# Patient Record
Sex: Male | Born: 1980 | Race: White | Hispanic: No | Marital: Single | Smoking: Former smoker
Health system: Southern US, Community
[De-identification: ages and names within clinical notes are randomized; demographics above are authoritative.]

## PROBLEM LIST (undated history)

## (undated) DIAGNOSIS — J45909 Unspecified asthma, uncomplicated: Secondary | ICD-10-CM

---

## 2001-09-19 ENCOUNTER — Encounter: Admission: RE | Admit: 2001-09-19 | Discharge: 2001-09-19 | Payer: Self-pay | Admitting: General Surgery

## 2001-09-19 ENCOUNTER — Encounter: Payer: Self-pay | Admitting: General Surgery

## 2006-09-01 ENCOUNTER — Emergency Department (HOSPITAL_COMMUNITY): Admission: EM | Admit: 2006-09-01 | Discharge: 2006-09-02 | Payer: Self-pay | Admitting: Emergency Medicine

## 2006-09-02 ENCOUNTER — Emergency Department (HOSPITAL_COMMUNITY): Admission: EM | Admit: 2006-09-02 | Discharge: 2006-09-03 | Payer: Self-pay | Admitting: Emergency Medicine

## 2006-09-05 ENCOUNTER — Emergency Department (HOSPITAL_COMMUNITY): Admission: EM | Admit: 2006-09-05 | Discharge: 2006-09-05 | Payer: Self-pay | Admitting: Family Medicine

## 2006-12-07 ENCOUNTER — Emergency Department (HOSPITAL_COMMUNITY): Admission: EM | Admit: 2006-12-07 | Discharge: 2006-12-07 | Payer: Self-pay | Admitting: Emergency Medicine

## 2008-12-12 ENCOUNTER — Emergency Department (HOSPITAL_COMMUNITY): Admission: EM | Admit: 2008-12-12 | Discharge: 2008-12-12 | Payer: Self-pay | Admitting: Family Medicine

## 2012-01-17 ENCOUNTER — Emergency Department (HOSPITAL_COMMUNITY): Payer: BC Managed Care – PPO

## 2012-01-17 ENCOUNTER — Encounter (HOSPITAL_COMMUNITY): Payer: Self-pay | Admitting: *Deleted

## 2012-01-17 ENCOUNTER — Emergency Department (HOSPITAL_COMMUNITY)
Admission: EM | Admit: 2012-01-17 | Discharge: 2012-01-17 | Disposition: A | Discharge status: Home / Self Care | Payer: BC Managed Care – PPO | Attending: Emergency Medicine | Admitting: Emergency Medicine

## 2012-01-17 DIAGNOSIS — Y93K1 Activity, walking an animal: Secondary | ICD-10-CM | POA: Insufficient documentation

## 2012-01-17 DIAGNOSIS — Z881 Allergy status to other antibiotic agents status: Secondary | ICD-10-CM | POA: Insufficient documentation

## 2012-01-17 DIAGNOSIS — X500XXA Overexertion from strenuous movement or load, initial encounter: Secondary | ICD-10-CM | POA: Insufficient documentation

## 2012-01-17 DIAGNOSIS — J45909 Unspecified asthma, uncomplicated: Secondary | ICD-10-CM | POA: Insufficient documentation

## 2012-01-17 DIAGNOSIS — Z87891 Personal history of nicotine dependence: Secondary | ICD-10-CM | POA: Insufficient documentation

## 2012-01-17 DIAGNOSIS — S43109A Unspecified dislocation of unspecified acromioclavicular joint, initial encounter: Secondary | ICD-10-CM | POA: Insufficient documentation

## 2012-01-17 HISTORY — DX: Unspecified asthma, uncomplicated: J45.909

## 2012-01-17 MED ORDER — OXYCODONE-ACETAMINOPHEN 5-325 MG PO TABS
2.0000 | ORAL_TABLET | Freq: Once | ORAL | Status: AC
  Administered 2012-01-17: 2 via ORAL
  Filled 2012-01-17: qty 2

## 2012-01-17 MED ORDER — ONDANSETRON 4 MG PO TBDP
8.0000 mg | ORAL_TABLET | Freq: Once | ORAL | Status: AC
  Administered 2012-01-17: 8 mg via ORAL
  Filled 2012-01-17: qty 2

## 2012-01-17 MED ORDER — OXYCODONE-ACETAMINOPHEN 5-325 MG PO TABS: 2.0000 | ORAL_TABLET | ORAL | Status: AC | PRN

## 2012-01-17 MED ORDER — NAPROXEN 500 MG PO TABS: 500.0000 mg | ORAL_TABLET | Freq: Two times a day (BID) | ORAL | Status: AC

## 2012-01-17 NOTE — ED Notes (Signed)
Patient returned from X-ray 

## 2012-01-17 NOTE — ED Notes (Signed)
Pt discharged in good condition after verbalizing understanding of discharge instructions.

## 2012-01-17 NOTE — Discharge Instructions (Signed)
Wear sling for comfort. Take pain medicine as needed. Please followup with orthopedics in one week if you're not feeling better.  Acromioclavicular Injuries The AC (acromioclavicular) joint is the joint in the shoulder where the collarbone (clavicle) meets the shoulder blade (scapula). The part of the shoulder blade connected to the collarbone is called the acromion. Common problems with and treatments for the [facility] joint are detailed below. ARTHRITIS Arthritis occurs when the joint has been injured and the smooth padding between the joints (cartilage) is lost. This is the wear and tear seen in most joints of the body if they have been overused. This causes the joint to produce pain and swelling which is worse with activity.  AC JOINT SEPARATION AC joint separation means that the ligaments connecting the acromion of the shoulder blade and collarbone have been damaged, and the two bones no longer line up. AC separations can be anywhere from mild to severe, and are "graded" depending upon which ligaments are torn and how badly they are torn.  Grade I Injury: the least damage is done, and the [facility] joint still lines up.   Grade II Injury: damage to the ligaments which reinforce the [facility] joint. In a Grade II injury, these ligaments are stretched but not entirely torn. When stressed, the [facility] joint becomes painful and unstable.   Grade III Injury: AC and secondary ligaments are completely torn, and the collarbone is no longer attached to the shoulder blade. This results in deformity; a prominence of the end of the clavicle.  AC JOINT FRACTURE AC joint fracture means that there has been a break in the bones of the [facility] joint, usually the end of the clavicle. TREATMENT TREATMENT OF AC ARTHRITIS  There is currently no way to replace the cartilage damaged by arthritis. The best way to improve the condition is to decrease the activities which aggravate the problem. Application of ice to the joint helps decrease pain  and soreness (inflammation). The use of non-steroidal anti-inflammatory medication is helpful.   If less conservative measures do not work, then cortisone shots (injections) may be used. These are anti-inflammatories; they decrease the soreness in the joint and swelling.   If non-surgical measures fail, surgery may be recommended. The procedure is generally removal of a portion of the end of the clavicle. This is the part of the collarbone closest to your acromion which is stabilized with ligaments to the acromion of the shoulder blade. This surgery may be performed using a tube-like instrument with a light (arthroscope) for looking into a joint. It may also be performed as an open surgery through a small incision by the surgeon. Most patients will have good range of motion within 6 weeks and may return to all activity including sports by 8-12 weeks, barring complications.  TREATMENT OF AN AC SEPARATION  The initial treatment is to decrease pain. This is best accomplished by immobilizing the arm in a sling and placing an ice pack to the shoulder for 20 to 30 minutes every 2 hours as needed. As the pain starts to subside, it is important to begin moving the fingers, wrist, elbow and eventually the shoulder in order to prevent a stiff or "frozen" shoulder. Instruction on when and how much to move the shoulder will be provided by your caregiver. The length of time needed to regain full motion and function depends on the amount or grade of the injury. Recovery from a Grade I AC separation usually takes 10 to 14 days, whereas a  Grade III may take 6 to 8 weeks.   Grade I and II separations usually do not require surgery. Even Grade III injuries usually allow return to full activity with few restrictions. Treatment is also based on the activity demands of the injured shoulder. For example, a high level quarterback with an injured throwing arm will receive more aggressive treatment than someone with a desk job who  rarely uses his/her arm for strenuous activities. In some cases, a painful lump may persist which could require a later surgery. Surgery can be very successful, but the benefits must be weighed against the potential risks.  TREATMENT OF AN AC JOINT FRACTURE Fracture treatment depends on the type of fracture. Sometimes a splint or sling may be all that is required. Other times surgery may be required for repair. This is more frequently the case when the ligaments supporting the clavicle are completely torn. Your caregiver will help you with these decisions and together you can decide what will be the best treatment. HOME CARE INSTRUCTIONS   Apply ice to the injury for 15 to 20 minutes each hour while awake for 2 days. Put the ice in a plastic bag and place a towel between the bag of ice and skin.   If a sling has been applied, wear it constantly for as long as directed by your caregiver, even at night. The sling or splint can be removed for bathing or showering or as directed. Be sure to keep the shoulder in the same place as when the sling is on. Do not lift the arm.   If a figure-of-eight splint has been applied it should be tightened gently by another person every day. Tighten it enough to keep the shoulders held back. Allow enough room to place the index finger between the body and strap. Loosen the splint immediately if there is numbness or tingling in the hands.   Take over-the-counter or prescription medicines for pain, discomfort or fever as directed by your caregiver.   If you or your child has received a follow up appointment, it is very important to keep that appointment in order to avoid long term complications, chronic pain or disability.  SEEK MEDICAL CARE IF:   The pain is not relieved with medications.   There is increased swelling or discoloration that continues to get worse rather than better.   You or your child has been unable to follow up as instructed.   There is  progressive numbness and tingling in the arm, forearm or hand.  SEEK IMMEDIATE MEDICAL CARE IF:   The arm is numb, cold or pale.   There is increasing pain in the hand, forearm or fingers.  MAKE SURE YOU:   Understand these instructions.   Will watch your condition.   Will get help right away if you are not doing well or get worse.  Document Released: 04/20/2005 Document Revised: 06/30/2011 Document Reviewed: 10/13/2008 [facility] Patient Information 2012 [place], [place].

## 2012-01-17 NOTE — ED Notes (Signed)
Pt was walking dog and dog went after squirrel.  Pt states that he tried to tough it out for the last 5 hours.  Pulse present

## 2012-01-17 NOTE — ED Notes (Signed)
Ortho at bedside.

## 2012-01-17 NOTE — ED Provider Notes (Signed)
History     CSN: [id number]  Arrival date & time 01/17/12  7829   First MD Initiated Contact with Patient 01/17/12 [phone]      Chief Complaint  Patient presents with  . Shoulder Pain    right    (Consider location/radiation/quality/duration/timing/severity/associated sxs/prior treatment) HPI 31 yo male presents to the ER c/o right shoulder pain.  Pt reports he was walking his dog when the dog started after a squirrel, pulling on his right arm.  Pain since the injury, about 6 hours ago.  No prior h/o similar pain.  Pain with trying to lift right arm up.  No elbow, wrist or hand pain.  Pt is LHD.  No other injury or complaint.   Past Medical History  Diagnosis Date  . Asthma     History reviewed. No pertinent past surgical history.  No family history on file.  History  Substance Use Topics  . Smoking status: Former Games developer  . Smokeless tobacco: Not on file  . Alcohol Use: Yes      Review of Systems  All other systems reviewed and are negative.    Allergies  Prednisone  Home Medications   Current Outpatient Rx  Name Route Sig Dispense Refill  . NAPROXEN 500 MG PO TABS Oral Take 1 tablet (500 mg total) by mouth 2 (two) times daily. 30 tablet 0  . OXYCODONE-ACETAMINOPHEN 5-325 MG PO TABS Oral Take 2 tablets by mouth every 4 (four) hours as needed for pain. 20 tablet 0    BP 128/84  Pulse 87  Temp 97.6 F (36.4 C) (Oral)  Resp 16  SpO2 96%  Physical Exam  Nursing note and vitals reviewed. Constitutional: He is oriented to person, place, and time. He appears well-developed and well-nourished. He appears distressed (uncomfortable appearing).  HENT:  Head: Normocephalic and atraumatic.  Nose: Nose normal.  Mouth/Throat: Oropharynx is clear and moist. No oropharyngeal exudate.  Neck: Normal range of motion. Neck supple. No JVD present. No tracheal deviation present. No thyromegaly present.  Pulmonary/Chest: No stridor.  Musculoskeletal: He exhibits tenderness  (decreased ROM at right shoulder due to pain.  Pain with palpation of right AC, mild edema without deformity). He exhibits no edema.  Lymphadenopathy:    He has no cervical adenopathy.  Neurological: He is alert and oriented to person, place, and time. He exhibits normal muscle tone. Coordination normal.  Skin: Skin is warm and dry. No rash noted. No erythema. No pallor.    ED Course  Procedures (including critical care time)  Labs Reviewed - No data to display Dg Shoulder Right  01/17/2012  *RADIOLOGY REPORT*  Clinical Data: Right shoulder joint pain after pulling injury.  RIGHT SHOULDER - 2+ VIEW  Comparison: None.  Findings: Minimal widening of the right acromioclavicular joint without displacement may suggest grade 1 ligamentous injury. Coracoclavicular spaces intact.  Sub acromial space is intact.  No evidence of glenohumeral joint dislocation.  No acute fractures demonstrated.  No focal bone lesion or bone destruction.  IMPRESSION: Slight widening of the acromioclavicular joint without displacement may suggest a low grade ligamentous injury.  No acute fracture or dislocation.  Original Report Authenticated By: Marlon Pel, M.D.     1. AC separation       MDM  31 yo male with acute injury to right shoulder, suspect grade 1 ac sprain/separation.  Will treat pain, place in sling and f/u with ortho as needed.        Olivia Mackie, MD  01/17/12 0745 

## 2016-01-19 ENCOUNTER — Encounter (HOSPITAL_COMMUNITY): Payer: Self-pay | Admitting: Adult Health

## 2016-01-19 DIAGNOSIS — J45909 Unspecified asthma, uncomplicated: Secondary | ICD-10-CM | POA: Insufficient documentation

## 2016-01-19 DIAGNOSIS — R109 Unspecified abdominal pain: Secondary | ICD-10-CM | POA: Diagnosis not present

## 2016-01-19 DIAGNOSIS — Z87891 Personal history of nicotine dependence: Secondary | ICD-10-CM | POA: Insufficient documentation

## 2016-01-19 LAB — COMPREHENSIVE METABOLIC PANEL
ALBUMIN: 4.1 g/dL (ref 3.5–5.0)
ALT: 22 U/L (ref 17–63)
AST: 19 U/L (ref 15–41)
Alkaline Phosphatase: 60 U/L (ref 38–126)
Anion gap: 6 (ref 5–15)
BUN: 11 mg/dL (ref 6–20)
CHLORIDE: 106 mmol/L (ref 101–111)
CO2: 26 mmol/L (ref 22–32)
Calcium: 9.4 mg/dL (ref 8.9–10.3)
Creatinine, Ser: 1.36 mg/dL — ABNORMAL HIGH (ref 0.61–1.24)
GFR calc Af Amer: >60 mL/min (ref >60)
GFR calc non Af Amer: >60 mL/min (ref >60)
GLUCOSE: 109 mg/dL — AB (ref 65–99)
POTASSIUM: 3.5 mmol/L (ref 3.5–5.1)
Sodium: 138 mmol/L (ref 135–145)
Total Bilirubin: 0.3 mg/dL (ref 0.3–1.2)
Total Protein: 6.6 g/dL (ref 6.5–8.1)

## 2016-01-19 LAB — CBC
HEMATOCRIT: 40.5 % (ref 39.0–52.0)
Hemoglobin: 13.8 g/dL (ref 13.0–17.0)
MCH: 29.4 pg (ref 26.0–34.0)
MCHC: 34.1 g/dL (ref 30.0–36.0)
MCV: 86.4 fL (ref 78.0–100.0)
Platelets: 296 10*3/uL (ref 150–400)
RBC: 4.69 MIL/uL (ref 4.22–5.81)
RDW: 12.4 % (ref 11.5–15.5)
WBC: 6.9 10*3/uL (ref 4.0–10.5)

## 2016-01-19 LAB — LIPASE, BLOOD: LIPASE: 29 U/L (ref 11–51)

## 2016-01-19 NOTE — ED Notes (Signed)
Presents with one month of left upper abdominal pain began to worsen over the past two weeks. Today began to have 2 bouts of diarrhea. Pain is described as diacomfort and constant 3 and worse at end of the day. denis constipation., denies nausea, vomitng and diarrhea.

## 2016-01-20 ENCOUNTER — Encounter (HOSPITAL_COMMUNITY): Payer: Self-pay | Admitting: Radiology

## 2016-01-20 ENCOUNTER — Emergency Department (HOSPITAL_COMMUNITY): Payer: 59

## 2016-01-20 ENCOUNTER — Emergency Department (HOSPITAL_COMMUNITY)
Admission: EM | Admit: 2016-01-20 | Discharge: 2016-01-20 | Disposition: A | Discharge status: Home / Self Care | Payer: 59 | Attending: Emergency Medicine | Admitting: Emergency Medicine

## 2016-01-20 ENCOUNTER — Inpatient Hospital Stay (HOSPITAL_COMMUNITY): Admission: AD | Admit: 2016-01-20 | Payer: 59 | Source: Ambulatory Visit | Admitting: General Surgery

## 2016-01-20 ENCOUNTER — Other Ambulatory Visit: Payer: Self-pay | Admitting: General Surgery

## 2016-01-20 DIAGNOSIS — R109 Unspecified abdominal pain: Secondary | ICD-10-CM

## 2016-01-20 LAB — URINALYSIS, ROUTINE W REFLEX MICROSCOPIC
Bilirubin Urine: NEGATIVE
Glucose, UA: NEGATIVE mg/dL
Hgb urine dipstick: NEGATIVE
Ketones, ur: 15 mg/dL — AB
LEUKOCYTES UA: NEGATIVE
Nitrite: NEGATIVE
PROTEIN: NEGATIVE mg/dL
SPECIFIC GRAVITY, URINE: 1.033 — AB (ref 1.005–1.030)
pH: 6.5 (ref 5.0–8.0)

## 2016-01-20 MED ORDER — IOPAMIDOL (ISOVUE-300) INJECTION 61%
INTRAVENOUS | Status: AC
  Administered 2016-01-20: 100 mL
  Filled 2016-01-20: qty 100

## 2016-01-20 MED ORDER — SODIUM CHLORIDE 0.9 % IV BOLUS (SEPSIS)
2000.0000 mL | Freq: Once | INTRAVENOUS | Status: AC
  Administered 2016-01-20: 2000 mL via INTRAVENOUS

## 2016-01-20 NOTE — ED Notes (Signed)
Pt returned to room with no assistance from family member.
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
## 2016-01-20 NOTE — ED Provider Notes (Signed)
35 year old male here with abdominal pain and CT scan concerning for appendix issues but no obvious appendicitis. Plan was for surgery to see the patient.   Surgery saw the patient and recommended laparoscopic appendectomy with exploration to evaluate the patient's abdominal pain however patient refused this time and rather see gastroenterology first he will be discharged AGAINST MEDICAL ADVICE however given follow-up information for surgery and for gastroenterology. Will return here for any new or worsening symptoms or if he changes his mind.  I determined that the patient had the capacity to make their decision and understood the consequences of that decision.We discussed the nature and purpose, risks and benefits, as well as, the alternatives of treatment. Time was given to allow the opportunity to ask questions and consider their options, and after the discussion, the patient decided to refuse the offerred treatment. The patient was informed that refusal could lead to, but was not limited to, death, permanent disability, or severe pain. If present, I asked the relatives or significant others to dissuade them without success. Even after refusal, I made every reasonable opportunity to treat them to the best of my ability.   Marily MemosJason Journei Thomassen, MD 01/20/16 [phone]

## 2016-01-20 NOTE — ED Provider Notes (Signed)
CSN: [id number]     Arrival date & time 01/19/16  2218 History   First MD Initiated Contact with Patient 01/20/16 0242     Chief Complaint  Patient presents with  . Abdominal Pain     (Consider location/radiation/quality/duration/timing/severity/associated sxs/prior Treatment) HPI Patient presents to the emergency department with abdominal pain over the last month.  The patient states that it seems to get worse over the last few days.  Patient states that he did not take any medications prior to arrival for symptoms.  He denies any alleviating factors.  He states that movement and palpation makes the pain worse. The patient denies chest pain, shortness of breath, headache,blurred vision, neck pain, fever, cough, weakness, numbness, dizziness, anorexia, edema, nausea, vomiting, diarrhea, rash, back pain, dysuria, hematemesis, bloody stool, near syncope, or syncope.  Patient denies any injury. Past Medical History  Diagnosis Date  . Asthma    History reviewed. No pertinent past surgical history. History reviewed. No pertinent family history. Social History  Substance Use Topics  . Smoking status: Former Games developermoker  . Smokeless tobacco: None  . Alcohol Use: Yes    Review of Systems All other systems negative except as documented in the HPI. All pertinent positives and negatives as reviewed in the HPI.   Allergies  Prednisone  Home Medications   Prior to Admission medications   Not on File   BP 127/92 mmHg  Pulse 81  Temp(Src) 97.8 F (36.6 C) (Oral)  Resp 18  Ht 5\' 10"  (1.778 m)  Wt 62.823 kg  BMI 19.87 kg/m2  SpO2 98% Physical Exam  Constitutional: He is oriented to person, place, and time. He appears well-developed and well-nourished. No distress.  HENT:  Head: Normocephalic and atraumatic.  Mouth/Throat: Oropharynx is clear and moist.  Eyes: Pupils are equal, round, and reactive to light.  Neck: Normal range of motion. Neck supple.  Cardiovascular: Normal rate,  regular rhythm and normal heart sounds.  Exam reveals no gallop and no friction rub.   No murmur heard. Pulmonary/Chest: Effort normal and breath sounds normal. No respiratory distress. He has no wheezes.  Abdominal: Soft. Bowel sounds are normal. He exhibits no distension. There is tenderness. There is guarding. There is no rebound.  Neurological: He is alert and oriented to person, place, and time. He exhibits normal muscle tone. Coordination normal.  Skin: Skin is warm and dry. No rash noted. No erythema.  Psychiatric: He has a normal mood and affect. His behavior is normal.  Nursing note and vitals reviewed.   ED Course  Procedures (including critical care time) Labs Review Labs Reviewed  COMPREHENSIVE METABOLIC PANEL - Abnormal; Notable for the following:    Glucose, Bld 109 (*)    Creatinine, Ser 1.36 (*)    All other components within normal limits  URINALYSIS, ROUTINE W REFLEX MICROSCOPIC (NOT AT [facility]) - Abnormal; Notable for the following:    APPearance CLOUDY (*)    Specific Gravity, Urine 1.033 (*)    Ketones, ur 15 (*)    All other components within normal limits  LIPASE, BLOOD  CBC    Imaging Review Ct Abdomen Pelvis W Contrast  01/20/2016  CLINICAL DATA:  Left upper abdominal pain for 1 month, worsened over the past 2 weeks. Diarrhea began today. EXAM: CT ABDOMEN AND PELVIS WITH CONTRAST TECHNIQUE: Multidetector CT imaging of the abdomen and pelvis was performed using the standard protocol following bolus administration of intravenous contrast. CONTRAST:  100mL ISOVUE-300 IOPAMIDOL (ISOVUE-300) INJECTION 61% COMPARISON:  None. FINDINGS: Lower chest:  No significant abnormality Hepatobiliary: There are normal appearances of the liver, gallbladder and bile ducts. Pancreas: Normal Spleen: Normal Adrenals/Urinary Tract: The adrenals and kidneys are normal in appearance. There is no urinary calculus evident. There is no hydronephrosis or ureteral dilatation. Collecting systems  and ureters appear unremarkable. Stomach/Bowel: There is mild dilatation of the appendix, up to 8 mm distally and 7 mm in its proximal to mid portions. There is mild fatty hypertrophy in the appendiceal wall. This abnormal appendix is not necessarily acutely inflamed, and there is a least 1 air bubble within the lumen. No periappendiceal abscess. Stomach, small bowel and remainder of the colon are unremarkable. Vascular/Lymphatic: The abdominal aorta is normal in caliber. There is no atherosclerotic calcification. There is no adenopathy in the abdomen or pelvis. Reproductive: No significant abnormality Other: No ascites. Musculoskeletal: No significant skeletal lesion. IMPRESSION: Abnormal appendix, mildly dilated but there is at least 1 air bubble within the appendiceal lumen. Mild fatty hypertrophy of the appendiceal wall. This could represent mild chronic inflammation or mucocele. No abscess. No extraluminal air. These results will be called to the ordering clinician or representative by the Radiologist Assistant, and communication documented in the PACS or zVision Dashboard. Electronically Signed   By: Ellery Plunkaniel R Mitchell M.D.   On: 01/20/2016 05:37   I have personally reviewed and evaluated these images and lab results as part of my medical decision-making.  Patient is advised plan I spoke with general surgery, Dr. Dwain SarnaWakefield, and he will be down to evaluate the patient's abdomen is a CT scan did not give a definitive diagnosis for this abdominal pain   Charlestine NightChristopher Okema Rollinson, PA-C 01/20/16 [id number]  Dione Boozeavid Glick, MD 01/20/16 [phone]

## 2016-01-20 NOTE — ED Notes (Signed)
Pt ambulated to restroom with help from family member.

## 2016-01-25 ENCOUNTER — Telehealth (HOSPITAL_BASED_OUTPATIENT_CLINIC_OR_DEPARTMENT_OTHER): Payer: Self-pay | Admitting: Emergency Medicine

## 2017-10-14 IMAGING — CT CT ABD-PELV W/ CM
2 of 4 series · 15 of 46 positions shown, 17 images · IV contrast (Omni 300)
Comparison: None.

CLINICAL DATA: Left upper abdominal pain for 1 month, worsened over
the past 2 weeks. Diarrhea began today.

EXAM:
CT ABDOMEN AND PELVIS WITH CONTRAST
TECHNIQUE: Multidetector CT imaging of the abdomen and pelvis was performed
using the standard protocol following bolus administration of
intravenous contrast.
CONTRAST:  100mL 85Q1LE-5EE IOPAMIDOL (85Q1LE-5EE) INJECTION 61%

[Series 2: a/p w/ 5mm · axial · 0.73mm/px · z∈[-587,-92]mm · 12 of 109 slices shown, 14 images]
[im 5/109  soft-tissue]
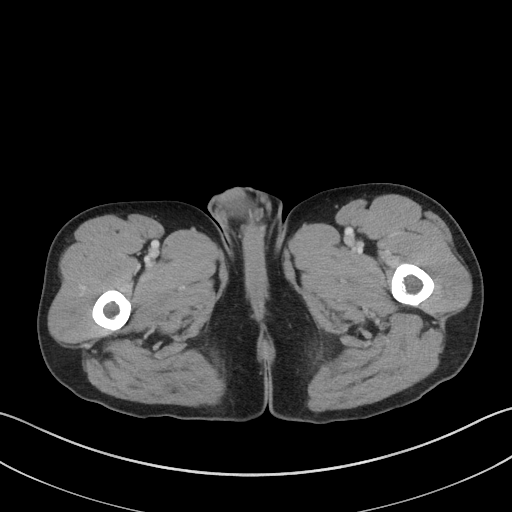
[im 5/109  bone]
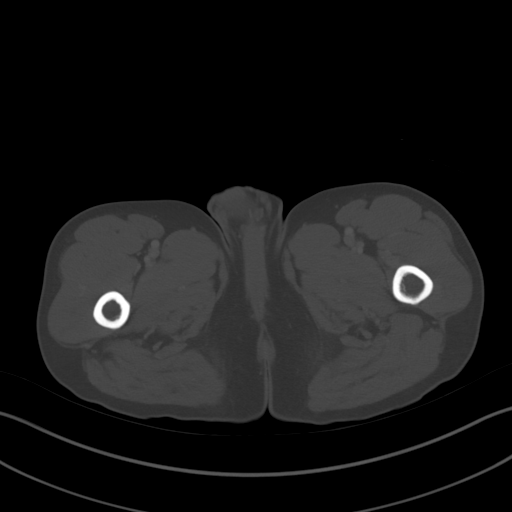
[im 15/109  soft-tissue]
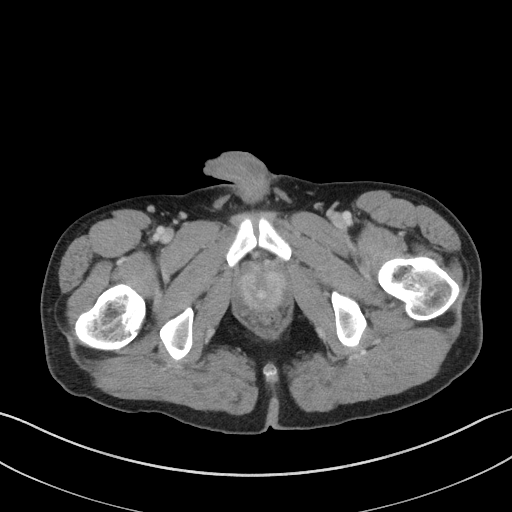
[im 24/109  soft-tissue]
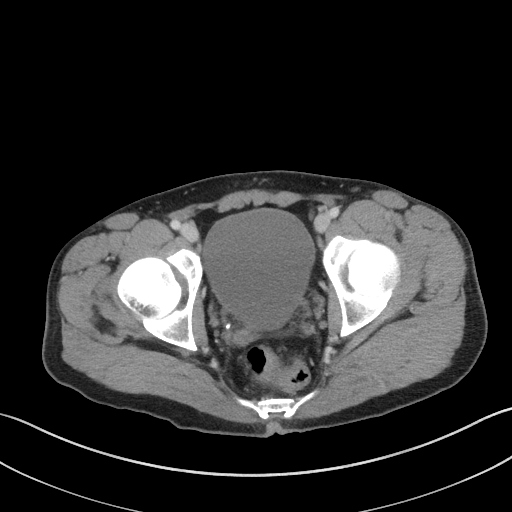
[im 33/109  soft-tissue]
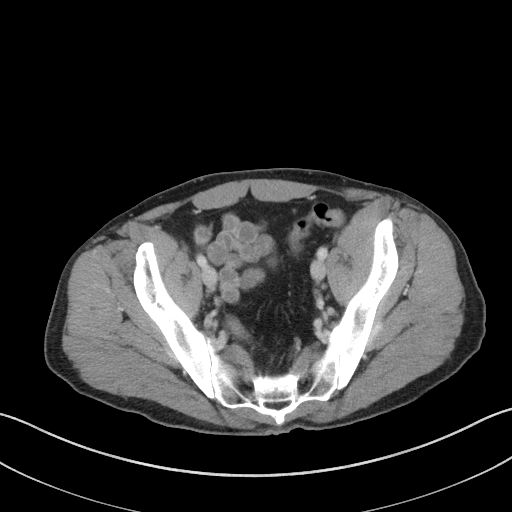
[im 43/109  soft-tissue]
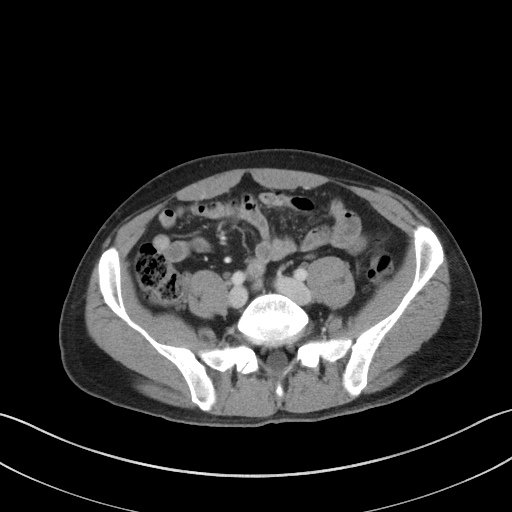
[im 52/109  soft-tissue]
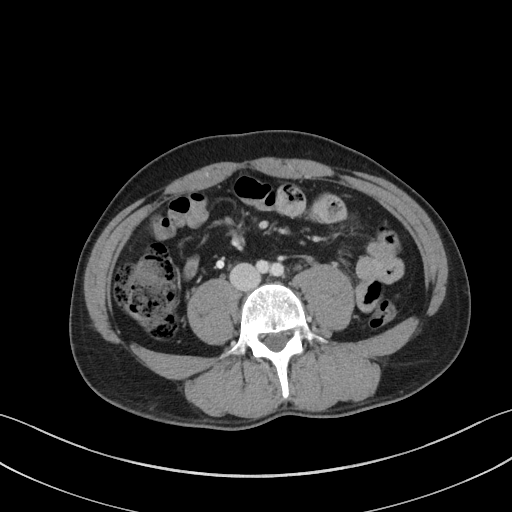
[im 57/109  soft-tissue]
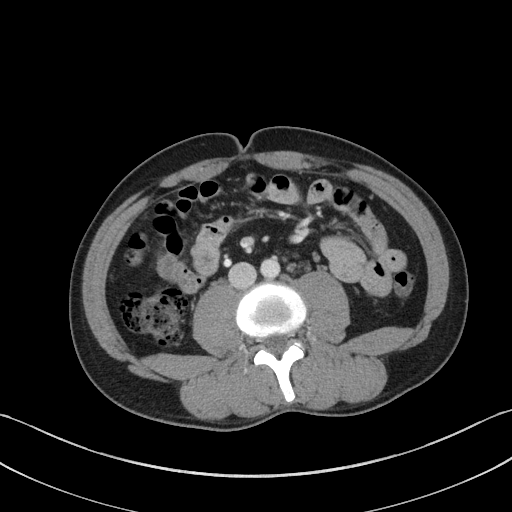
[im 66/109  soft-tissue]
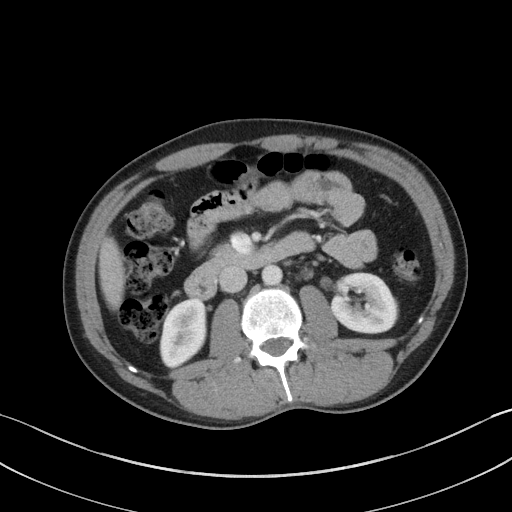
[im 76/109  soft-tissue]
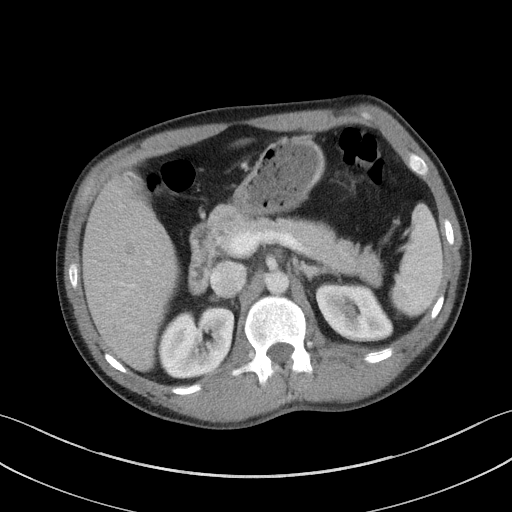
[im 76/109  bone]
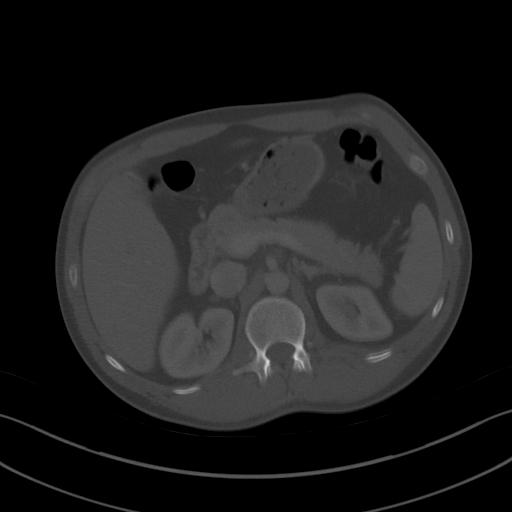
[im 85/109  soft-tissue]
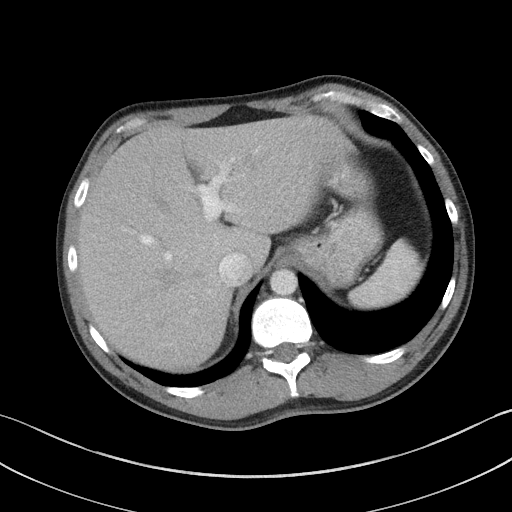
[im 94/109  soft-tissue]
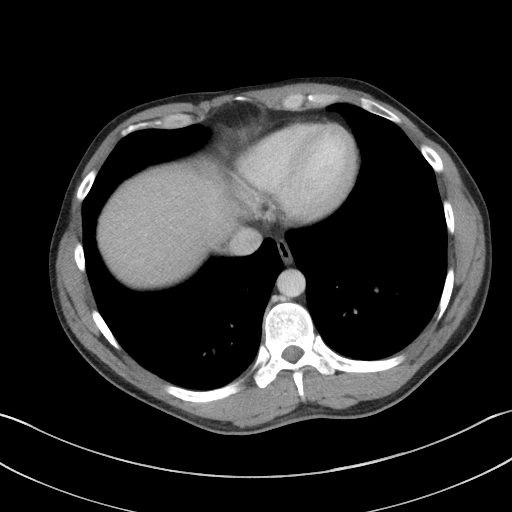
[im 104/109  soft-tissue]
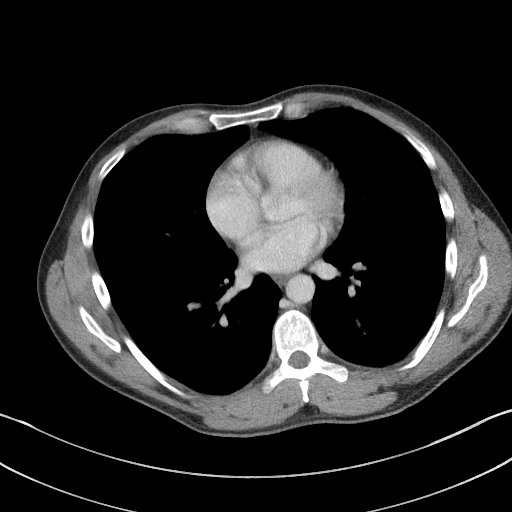

[Series 5: a/p w/ cor · coronal · 0.70mm/px · 3 of 127 slices shown]
[im 43/127  soft-tissue]
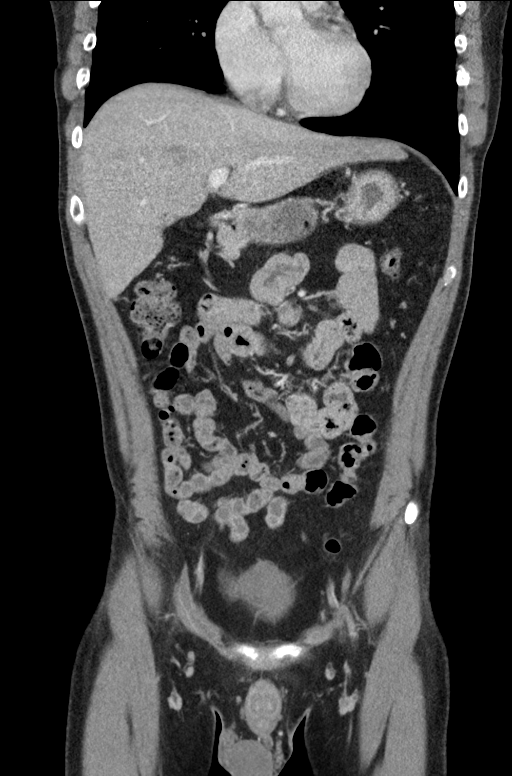
[im 57/127  soft-tissue]
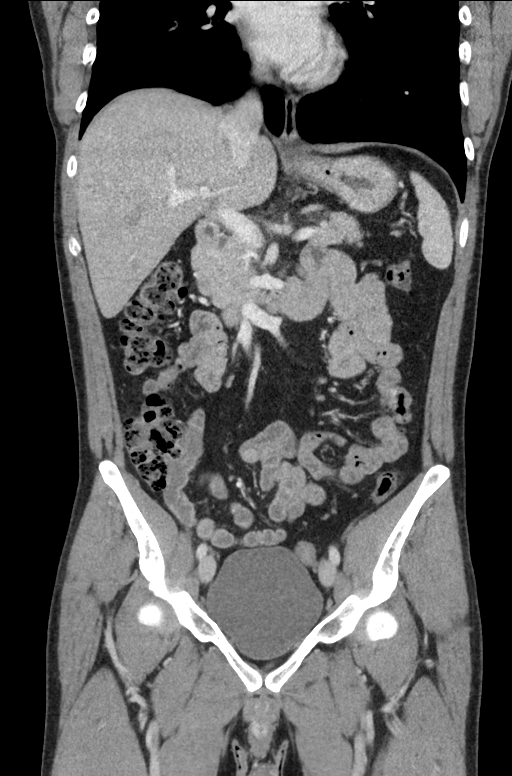
[im 71/127  soft-tissue]
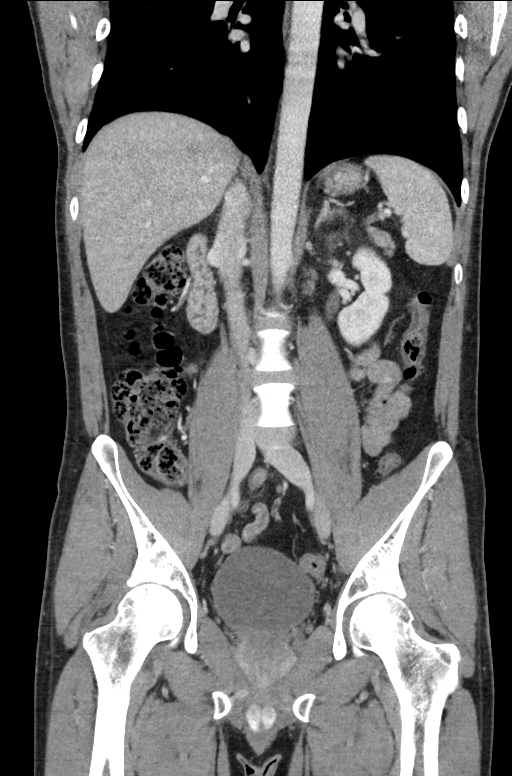

[15 of 46 positions shown; findings below may reference images not displayed]

FINDINGS: Lower chest:  No significant abnormality

Hepatobiliary: There are normal appearances of the liver,
gallbladder and bile ducts.

Pancreas: Normal

Spleen: Normal

Adrenals/Urinary Tract: The adrenals and kidneys are normal in
appearance. There is no urinary calculus evident. There is no
hydronephrosis or ureteral dilatation. Collecting systems and
ureters appear unremarkable.

Stomach/Bowel: There is mild dilatation of the appendix, up to 8 mm
distally and 7 mm in its proximal to mid portions. There is mild
fatty hypertrophy in the appendiceal wall. This abnormal appendix is
not necessarily acutely inflamed, and there is a least 1 air bubble
within the lumen. No periappendiceal abscess.

Stomach, small bowel and remainder of the colon are unremarkable.

Vascular/Lymphatic: The abdominal aorta is normal in caliber. There
is no atherosclerotic calcification. There is no adenopathy in the
abdomen or pelvis.

Reproductive: No significant abnormality

Other: No ascites.

Musculoskeletal: No significant skeletal lesion.
IMPRESSION: Abnormal appendix, mildly dilated but there is at least 1 air bubble
within the appendiceal lumen. Mild fatty hypertrophy of the
appendiceal wall. This could represent mild chronic inflammation or
mucocele. No abscess. No extraluminal air. These results will be
called to the ordering clinician or representative by the
Radiologist Assistant, and communication documented in the PACS or
zVision Dashboard.

## 2019-08-26 ENCOUNTER — Other Ambulatory Visit: Payer: Self-pay

## 2019-08-26 ENCOUNTER — Encounter (HOSPITAL_COMMUNITY): Payer: Self-pay | Admitting: Psychiatry

## 2019-08-26 ENCOUNTER — Observation Stay (HOSPITAL_COMMUNITY)
Admission: AD | Admit: 2019-08-26 | Discharge: 2019-08-26 | Disposition: A | Discharge status: Home / Self Care | Payer: 59 | Attending: Psychiatry | Admitting: Psychiatry

## 2019-08-26 DIAGNOSIS — J45909 Unspecified asthma, uncomplicated: Secondary | ICD-10-CM | POA: Insufficient documentation

## 2019-08-26 DIAGNOSIS — F4325 Adjustment disorder with mixed disturbance of emotions and conduct: Principal | ICD-10-CM | POA: Diagnosis present

## 2019-08-26 DIAGNOSIS — Z87891 Personal history of nicotine dependence: Secondary | ICD-10-CM | POA: Insufficient documentation

## 2019-08-26 DIAGNOSIS — Z79899 Other long term (current) drug therapy: Secondary | ICD-10-CM | POA: Insufficient documentation

## 2019-08-26 DIAGNOSIS — Z20822 Contact with and (suspected) exposure to covid-19: Secondary | ICD-10-CM | POA: Insufficient documentation

## 2019-08-26 DIAGNOSIS — Z046 Encounter for general psychiatric examination, requested by authority: Secondary | ICD-10-CM | POA: Diagnosis not present

## 2019-08-26 DIAGNOSIS — Z888 Allergy status to other drugs, medicaments and biological substances status: Secondary | ICD-10-CM | POA: Insufficient documentation

## 2019-08-26 LAB — COMPREHENSIVE METABOLIC PANEL
ALT: 33 U/L (ref 0–44)
AST: 28 U/L (ref 15–41)
Albumin: 4.7 g/dL (ref 3.5–5.0)
Alkaline Phosphatase: 74 U/L (ref 38–126)
Anion gap: 14 (ref 5–15)
BUN: 18 mg/dL (ref 6–20)
CO2: 22 mmol/L (ref 22–32)
Calcium: 9.6 mg/dL (ref 8.9–10.3)
Chloride: 105 mmol/L (ref 98–111)
Creatinine, Ser: 1.04 mg/dL (ref 0.61–1.24)
GFR calc Af Amer: >60 mL/min (ref >60)
GFR calc non Af Amer: >60 mL/min (ref >60)
Glucose, Bld: 76 mg/dL (ref 70–99)
Potassium: 3.7 mmol/L (ref 3.5–5.1)
Sodium: 141 mmol/L (ref 135–145)
Total Bilirubin: 1.2 mg/dL (ref 0.3–1.2)
Total Protein: 7.7 g/dL (ref 6.5–8.1)

## 2019-08-26 LAB — RESPIRATORY PANEL BY RT PCR (FLU A&B, COVID)
Influenza A by PCR: NEGATIVE
Influenza B by PCR: NEGATIVE
SARS Coronavirus 2 by RT PCR: NEGATIVE

## 2019-08-26 LAB — CBC
HCT: 49.5 % (ref 39.0–52.0)
Hemoglobin: 16.5 g/dL (ref 13.0–17.0)
MCH: 30.4 pg (ref 26.0–34.0)
MCHC: 33.3 g/dL (ref 30.0–36.0)
MCV: 91.2 fL (ref 80.0–100.0)
Platelets: 302 10*3/uL (ref 150–400)
RBC: 5.43 MIL/uL (ref 4.22–5.81)
RDW: 13.1 % (ref 11.5–15.5)
WBC: 7.7 10*3/uL (ref 4.0–10.5)
nRBC: 0 % (ref 0.0–0.2)

## 2019-08-26 MED ORDER — ACETAMINOPHEN 325 MG PO TABS: 650.0000 mg | ORAL_TABLET | Freq: Four times a day (QID) | ORAL | Status: DC | PRN

## 2019-08-26 MED ORDER — ALBUTEROL SULFATE HFA 108 (90 BASE) MCG/ACT IN AERS: 2.0000 | INHALATION_SPRAY | RESPIRATORY_TRACT | Status: DC | PRN

## 2019-08-26 MED ORDER — ALUM & MAG HYDROXIDE-SIMETH 200-200-20 MG/5ML PO SUSP: 30.0000 mL | ORAL | Status: DC | PRN

## 2019-08-26 MED ORDER — MAGNESIUM HYDROXIDE 400 MG/5ML PO SUSP: 30.0000 mL | Freq: Every day | ORAL | Status: DC | PRN

## 2019-08-26 MED ORDER — HYDROXYZINE HCL 25 MG PO TABS: 25.0000 mg | ORAL_TABLET | Freq: Three times a day (TID) | ORAL | Status: DC | PRN

## 2019-08-26 NOTE — Progress Notes (Signed)
Discharge:  Pt is a caucasian male of 38 years. Pt is alert and oriented X 4. Pt given AVS packet to include medication information update,and other resource information. Pt given all belongings and signature obtained. Pt discharged to private resident . Transportation provided my friend.   Einar Crow. Melvyn Neth MSN, RN, [facility] [phone]

## 2019-08-26 NOTE — Progress Notes (Signed)
Patient ID: Dakota Gordon, male   DOB: May 08, 1981, 39 y.o.   MRN: [id number] Patient transferred to the [address] without incidence, he is calm and cooperative, Q15 minute checks initiated, report given to assigned RN for day shift.

## 2019-08-26 NOTE — BH Assessment (Signed)
[facility] Assessment Progress Note  Per Nelly Rout, MD, this pt does not require psychiatric hospitalization at this time.  Pt presents under IVC initiated by pt's mother, which Dr Lucianne Muss has rescinded.  Pt it to be discharged from the [facility] with recommendation to follow up with the [facility] [place].  This Clinical research associate spoke to pt directly, offering information about the Mental Health Intensive Outpatient Program.  Pt is interested in it, but is unable to commit to it at this time.  I informed him that the Outpatient Clinic also offers routine outpatient services.  Contact information for the Outpatient Clinic has been included in pt's discharge instructions, along with a brief description of the pertinent services offered.  Pt's nurse has been notified.  Doylene Canning, MA Triage Specialist [phone]

## 2019-08-26 NOTE — Progress Notes (Signed)
[facility] NOVEL CORONAVIRUS (COVID-19) DAILY CHECK-OFF SYMPTOMS - answer yes or no to each - every day NO YES  Have you had a fever in the past 24 hours?  . Fever (Temp > 37.80C / 100F) X   Have you had any of these symptoms in the past 24 hours? . New Cough .  Sore Throat  .  Shortness of Breath .  Difficulty Breathing .  Unexplained Body Aches   X   Have you had any one of these symptoms in the past 24 hours not related to allergies?   . Runny Nose .  Nasal Congestion .  Sneezing   X   If you have had runny nose, nasal congestion, sneezing in the past 24 hours, has it worsened?  X   EXPOSURES - check yes or no X   Have you traveled outside the state in the past 14 days?  X   Have you been in contact with someone with a confirmed diagnosis of COVID-19 or PUI in the past 14 days without wearing appropriate PPE?  X   Have you been living in the same home as a person with confirmed diagnosis of COVID-19 or a PUI (household contact)?    X   Have you been diagnosed with COVID-19?    X              What to do next: Answered NO to all: Answered YES to anything:   Proceed with unit schedule Follow the [facility] Inpatient Flowsheet.   Tameika Heckmann K. Ahmere Hemenway MSN, RN, [facility] [phone] 
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
## 2019-08-26 NOTE — Discharge Instructions (Signed)
For your behavioral health needs, you are advised to follow up with the [facility] at [facility].  We offer a wide range of services including outpatient therapy and psychiatry.  We also offer a Mental Health Intensive Outpatient Program (MH-IOP).  This program meets Monday - Friday from 9:00 am - 12:00 pm.  Due to Covid-19, the program is currently virtual.  For more details about this program, contact Jeri Modena, MEd at the number indicated below at your earliest opportunity.  She can also arrange for routine outpatient services for you:       [facility] at [facility]. [employer]. [address] [place] [address]      Contact person: Jeri Modena, MEd      [phone]

## 2019-08-26 NOTE — Progress Notes (Signed)
Dakota Gordon is a 39 y.o. male involuntary admitted for suicidal ideation with a plan to blow his head off with the gun, but pt denied having made the statement. Pt stated him and his mother does not get along well, stated his mother tried to IVC him when he was 16 yrs and did not. Pt also stated his mother has tried also to IVC his brother and sister in the past. Pt is blaming his mother for sending him to the hospital. Pt is calm and cooperative with admission process,  alert and oriented, denied SI/HI, AVH at this time and contacted for safety.  Consents signed, skin/belongings search completed and pt oriented to unit. Pt stable at this time. Pt given the opportunity to express concerns and ask questions. Pt given toiletries. Will continue to monitor.

## 2019-08-26 NOTE — Progress Notes (Signed)
  Pt is caucasian male of 38 years, presents IVC with SI with a plan shot self and stab others.     Pt reports poor appetite, energy level normal, and poor sleeping pattern. Pt denies  Depression and anxiety today .  Pt denies SI, HI, or AVH today.  Pt reports last BM today, denies  pain today Vitals signs being monitored. Medication given as Rx, no adverse reaction observed, safety maintained with q15 minute checks.   Einar Crow. Melvyn Neth MSN, RN, [facility] [phone]

## 2019-08-26 NOTE — Plan of Care (Signed)
[facility] Observation Crisis Plan  Reason for Crisis Plan:  Crisis Stabilization   Plan of Care:  Referral for Inpatient Hospitalization  Family Support:      Current Living Environment:  Living Arrangements: Alone  Insurance:   Hospital Account    Name Acct ID Class Status Primary Coverage   Flay, Ghosh [id number] BEHAVIORAL HEALTH OBSERVATION Open UNITED [employer]CARE - UNITED [employer] OTHER        Guarantor Account (for Hospital Account [id number])    Name Relation to Pt Service Area Active? Acct Type   Juleen China Self [facility] Yes Behavioral Health   Address Phone       [address] [place], [place] [address] [phone](H)          Coverage Information (for Hospital Account [id number])    F/O Payor/Plan Precert #   [facility] OTHER    Subscriber Subscriber #   Devean, Skoczylas [id number]   Address Phone   [address] [place] [address] [phone]      Legal Guardian:  Legal Guardian: Other:(Self)  Primary Care Provider:  Patient, No Pcp Per  Current Outpatient Providers:  [facility]  Psychiatrist:  Name of Psychiatrist: None  Counselor/Therapist:  Name of Therapist: None  Compliant with Medications:  No  Additional Information:   Bethann Punches 2/1/20213:51 AM

## 2019-08-26 NOTE — Discharge Summary (Addendum)
[name]  08/26/2019 11:05 AM Dakota Gordon  MRN:  [id number] Principal Problem: Adjustment disorder with mixed disturbance of emotions and conduct Discharge Diagnoses: Principal Problem:   Adjustment disorder with mixed disturbance of emotions and conduct Active Problems:   Involuntary commitment   Subjective: Dakota Gordon, 39 y.o., male patient seen via tele psych by this provider, Dr. Lucianne Muss; and chart reviewed on 08/26/19.  On evaluation Dakota Gordon reports that he got into a verbal argument with his brother who then called the mother and :she took out papers on me saying that I wanted to kill myself.  I have never wanted to kill myself and never had any suicidal thoughts.  Me and my mom I am not close, but me and my dad and my step mom are; you can call and talk to them and they can take you that I would never like this and have never said anything about 1 hurt myself."  Patient denies suicidal/self-harm/homicidal ideations, psychosis, paranoia.  Patient gave permission to speak to his father [name] at [phone]. During evaluation [name] is alert/oriented x 4; calm/cooperative; and mood is congruent with affect.  He does not appear to be responding to internal/external stimuli or delusional thoughts.  Patient denies suicidal/self-harm/homicidal ideation, psychosis, and paranoia.  Patient answered question appropriately.    Collateral information: Spoke to patient's father who states he has spoken to the patient several times and that the patient seems to be doing okay to him and feels that the patient is safe to go home.  Reports the patient has never made any accusations about wanting to kill or harm himself.  States that he does have a issue with anger but no suicidal ideation.  Patient's father states that he feels he is safe to go home and does not feel that the patient is a danger to himself or anyone else.  Total Time spent  with patient: 30 minutes  Past Psychiatric History: Denies prior psychiatric history  Past Medical History:  Past Medical History:  Diagnosis Date  . Asthma    History reviewed. No pertinent surgical history. Family History: History reviewed. No pertinent family history. Family Psychiatric  History: Denies Social History:  Social History   Substance and Sexual Activity  Alcohol Use Yes     Social History   Substance and Sexual Activity  Drug Use No    Social History   Socioeconomic History  . Marital status: Single    Spouse name: Not on file  . Number of children: Not on file  . Years of education: Not on file  . Highest education level: Not on file  Occupational History  . Not on file  Tobacco Use  . Smoking status: Former Games developer  . Smokeless tobacco: Never Used  Substance and Sexual Activity  . Alcohol use: Yes  . Drug use: No  . Sexual activity: Not on file  Other Topics Concern  . Not on file  Social History Narrative  . Not on file   Social Determinants of Health   Financial Resource Strain:   . Difficulty of Paying Living Expenses: Not on file  Food Insecurity:   . Worried About Programme researcher, broadcasting/film/video in the Last Year: Not on file  . Ran Out of Food in the Last Year: Not on file  Transportation Needs:   . Lack of Transportation (Medical): Not on file  . Lack of Transportation (Non-Medical): Not on file  Physical Activity:   .  Days of Exercise per Week: Not on file  . Minutes of Exercise per Session: Not on file  Stress:   . Feeling of Stress : Not on file  Social Connections:   . Frequency of Communication with Friends and Family: Not on file  . Frequency of Social Gatherings with Friends and Family: Not on file  . Attends Religious Services: Not on file  . Active Member of Clubs or Organizations: Not on file  . Attends Archivist Meetings: Not on file  . Marital Status: Not on file    Has this patient used any form of tobacco in the  last 30 days? (Cigarettes, Smokeless Tobacco, Cigars, and/or Pipes) Prescription not provided because: Patient denies use of tobacco products  Current Medications: Current Facility-Administered Medications  Medication Dose Route Frequency Provider Last Rate Last Admin  . acetaminophen (TYLENOL) tablet 650 mg  650 mg Oral Q6H PRN Lindon Romp A, NP      . albuterol (VENTOLIN HFA) 108 (90 Base) MCG/ACT inhaler 2 puff  2 puff Inhalation Q3H PRN Rozetta Nunnery, NP      . alum & mag hydroxide-simeth (MAALOX/MYLANTA) 200-200-20 MG/5ML suspension 30 mL  30 mL Oral Q4H PRN Lindon Romp A, NP      . hydrOXYzine (ATARAX/VISTARIL) tablet 25 mg  25 mg Oral TID PRN Lindon Romp A, NP      . magnesium hydroxide (MILK OF MAGNESIA) suspension 30 mL  30 mL Oral Daily PRN Rozetta Nunnery, NP       PTA Medications: Medications Prior to Admission  Medication Sig Dispense Refill Last Dose  . albuterol (VENTOLIN HFA) 108 (90 Base) MCG/ACT inhaler Inhale 2 puffs into the lungs every 3 (three) hours as needed.   Past Week at Unknown time  . diphenhydrAMINE (BENADRYL) 25 mg capsule Take 25 mg by mouth daily at 8 pm.   Past Week at Unknown time    Musculoskeletal: Strength & Muscle Tone: within normal limits Gait & Station: normal Patient leans: N/A  Psychiatric Specialty Exam: Physical Exam  Nursing note and vitals reviewed. Constitutional: He is oriented to person, place, and time. He appears well-developed and well-nourished. No distress.  Respiratory: Effort normal.  Musculoskeletal:        General: Normal range of motion.     Cervical back: Normal range of motion.  Neurological: He is alert and oriented to person, place, and time.  Skin: Skin is warm and dry.  Psychiatric: He has a normal mood and affect. His speech is normal and behavior is normal. Judgment and thought content normal. Cognition and memory are normal.    Review of Systems  Psychiatric/Behavioral: Negative for depression,  hallucinations, memory loss and suicidal ideas. The patient is not nervous/anxious and does not have insomnia.   All other systems reviewed and are negative.   Blood pressure (!) 131/94, pulse (!) 106, temperature 98.9 F (37.2 C), temperature source Oral, resp. rate 16, height 5\' 9"  (1.753 m), weight 65.8 kg, SpO2 96 %.Body mass index is 21.41 kg/m.  General Appearance: Casual  Eye Contact:  Good  Speech:  Clear and Coherent and Normal Rate  Volume:  Normal  Mood:  "Good."  Appropriate  Affect:  Appropriate and Congruent  Thought Process:  Coherent, Goal Directed and Descriptions of Associations: Intact  Orientation:  Full (Time, Place, and Person)  Thought Content:  WDL  Suicidal Thoughts:  No  Homicidal Thoughts:  No  Memory:  Immediate;   Good Recent;  Good  Judgement:  Intact  Insight:  Present  Psychomotor Activity:  Normal  Concentration:  Concentration: Good and Attention Span: Good  Recall:  Good  Fund of Knowledge:  Good  Language:  Good  Akathisia:  No  Handed:  Right  AIMS (if indicated):     Assets:  Communication Skills Desire for Improvement Housing Physical Health Resilience Social Support  ADL's:  Intact  Cognition:  WNL  Sleep:        Demographic Factors:  Male and Caucasian  Loss Factors: NA  Historical Factors: NA  Risk Reduction Factors:   Sense of responsibility to family, Religious beliefs about death, Employed and Living with another person, especially a relative  Continued Clinical Symptoms:  Family discord  Cognitive Features That Contribute To Risk:  None    Suicide Risk:  Minimal: No identifiable suicidal ideation.  Patients presenting with no risk factors but with morbid ruminations; may be classified as minimal risk based on the severity of the depressive symptoms    Plan Of Care/Follow-up recommendations:  Activity:  As tolerated Diet:  Heart healthy    Discharge Instructions     For your behavioral health needs,  you are advised to follow up with the [facility] at [facility]  We offer a wide range of services including outpatient therapy and psychiatry.  We also offer a Mental Health Intensive Outpatient Program (MH-IOP).  This program meets Monday - Friday from 9:00 am - 12:00 pm.  Due to Covid-19, the program is currently virtual.  For more details about this program, contact Jeri Modena, MEd at the number indicated below at your earliest opportunity.  She can also arrange for routine outpatient services for you:       [facility]. [employer] [facility]. [address] [place] [address]      Contact person: Jeri Modena, MEd      [phone]    Disposition:  Patient psychiatrically cleared No evidence of imminent risk to self or others at present.   Patient does not meet criteria for psychiatric inpatient admission. Supportive therapy provided about ongoing stressors. Refer to IOP. Discussed crisis plan, support from social network, calling 911, coming to the Emergency Department, and calling Suicide Hotline.  Shuvon Rankin, NP 08/26/2019, 11:05 AM
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
## 2019-11-01 ENCOUNTER — Ambulatory Visit: Payer: Self-pay | Attending: Internal Medicine

## 2019-11-01 DIAGNOSIS — Z23 Encounter for immunization: Secondary | ICD-10-CM

## 2019-11-01 NOTE — Progress Notes (Signed)
   Covid-19 Vaccination Clinic  Name:  TORREY BALLINAS    MRN: [id number] DOB: 1980-09-28  11/01/2019  Mr. Bacha was observed post Covid-19 immunization for 15 minutes without incident. He was provided with Vaccine Information Sheet and instruction to access the V-Safe system.   Mr. Stegman was instructed to call 911 with any severe reactions post vaccine: Marland Kitchen Difficulty breathing  . Swelling of face and throat  . A fast heartbeat  . A bad rash all over body  . Dizziness and weakness   Immunizations Administered    Name Date Dose VIS Date Route   Pfizer COVID-19 Vaccine 11/01/2019 12:09 PM 0.3 mL 07/05/2019 Intramuscular   Manufacturer: [employer], [employer]   Lot: [id number]   NDC: [address]-7

## 2019-11-19 ENCOUNTER — Other Ambulatory Visit: Payer: Self-pay

## 2019-11-19 ENCOUNTER — Encounter: Payer: Self-pay | Admitting: Family Medicine

## 2019-11-19 ENCOUNTER — Ambulatory Visit (INDEPENDENT_AMBULATORY_CARE_PROVIDER_SITE_OTHER): Payer: Managed Care, Other (non HMO) | Admitting: Family Medicine

## 2019-11-19 ENCOUNTER — Telehealth: Payer: Self-pay | Admitting: Family Medicine

## 2019-11-19 VITALS — BP 122/73 | HR 112 | Temp 97.2°F | Resp 20 | Ht 69.0 in | Wt 165.0 lb

## 2019-11-19 DIAGNOSIS — J45901 Unspecified asthma with (acute) exacerbation: Secondary | ICD-10-CM | POA: Diagnosis not present

## 2019-11-19 MED ORDER — ALBUTEROL SULFATE HFA 108 (90 BASE) MCG/ACT IN AERS
2.0000 | INHALATION_SPRAY | Freq: Four times a day (QID) | RESPIRATORY_TRACT | 0 refills | Status: AC | PRN
Start: 2019-11-19 — End: ?

## 2019-11-19 NOTE — Telephone Encounter (Signed)
Patient walked in for urgent evaluation for dyspnea Pulse 112 bp 126/73 spo2 95% Ht 5'9" Weight 165 lbs

## 2019-11-19 NOTE — Addendum Note (Signed)
Addended by: Collie Siad A on: 11/19/2019 08:41 AM   Modules accepted: Orders

## 2019-11-19 NOTE — Patient Instructions (Signed)
Asthma Attack  Acute bronchospasm caused by asthma is also referred to as an asthma attack. Bronchospasm means that the air passages become narrowed or "tight," which limits the amount of oxygen that can get into the lungs. The narrowing is caused by inflammation and tightening of the muscles in the air tubes (bronchi) in the lungs. Excessive mucus is also produced, which narrows the airways more. This can cause trouble breathing, coughing, and loud breathing (wheezing). What are the causes? Possible triggers include:  Animal dander from the skin, hair, or feathers of animals.  Dust mites contained in house dust.  Cockroaches.  Pollen from trees or grass.  Mold.  Cigarette or tobacco smoke.  Air pollutants such as dust, household cleaners, hair sprays, aerosol sprays, paint fumes, strong chemicals, or strong odors.  Cold air or weather changes. Cold air may trigger inflammation. Winds increase molds and pollens in the air.  Strong emotions such as crying or laughing hard.  Stress.  Certain medicines, such as aspirin or beta-blockers.  Sulfites in foods and drinks, such as dried fruits and wine.  Infections or inflammatory conditions, such as a flu, a cold, pneumonia, or inflammation of the nasal membranes (rhinitis).  Gastroesophageal reflux disease (GERD). GERD is a condition in which stomach acid backs up into your esophagus, which can irritate nearby airway structures.  Exercise or activity that requires a lot of energy. What are the signs or symptoms? Symptoms of this condition include:  Wheezing. This may sound like whistling while breathing. This may be more noticeable at night.  Excessive coughing, particularly at night.  Chest tightness or pain.  Shortness of breath.  Feeling like you cannot get enough air no matter how hard you try (air hunger). How is this diagnosed? This condition may be diagnosed based on:  Your medical history.  Your symptoms.  A  physical exam.  Tests to check for other causes of your symptoms or other conditions that may have triggered your asthma attack. These tests may include: ? Chest X-ray. ? Blood tests. ? Specialized tests to assess lung function, such as breathing into a device that measures how much air you inhale and exhale (spirometry). How is this treated? The goal of treatment is to open the airways in your lungs and reduce inflammation. Most asthma attacks are treated with medicines that you inhale through a hand-held inhaler (metered dose inhaler, MDI) or a device that turns liquid medicine into a mist that you inhale (nebulizer). Medicines may include:  Quick relief or rescue medicines that relax the muscles of the bronchi. These medicines include bronchodilators, such as albuterol.  Controller medicines, such as inhaled corticosteroids. These are long-acting medicines that are used for daily asthma maintenance. If you have a moderate or severe asthma attack, you may be treated with steroid medicines by mouth or through an IV injection at the hospital. Steroid medicines reduce inflammation in your lungs. Depending on the severity of your attack, you may need oxygen therapy to help you breathe. If your asthma attack was caused by a bacterial infection, such as pneumonia, you will be given antibiotic medicines. Follow these instructions at home: Medicines  Take over-the-counter and prescription medicines only as told by your health care provider. Keep your medicines up-to-date and available.  If you are more than [redacted] weeks pregnant and you are prescribed any new medicines, tell your obstetrician about those medicines.  If you were prescribed an antibiotic medicine, take it as told by your health care provider. Do not   stop taking the antibiotic even if you start to feel better. Avoiding triggers   Keep track of things that trigger your asthma attacks or cause you to have breathing problems, and avoid  exposure to these triggers.  Do not use any products that contain nicotine or tobacco, such as cigarettes and e-cigarettes. If you need help quitting, ask your health care provider.  Avoid secondhand smoke.  Avoid strong smells, such as perfumes, aerosols, and cleaning solvents.  When pollen or air pollution is bad, keep windows closed and use an air conditioner or go to places with air conditioning. Asthma action plan  Work with your health care provider to make a written plan for managing and treating your asthma attacks (asthma action plan). This plan should include: ? A list of your asthma triggers and how to avoid them. ? Information about when your medicines should be taken and when their dosage should be changed. ? Instructions about using a device called a peak flow meter to monitor your condition. A peak flow meter measures how well your lungs are working and measures how severe your asthma is at a given time. Your "personal best" is the highest peak flow rate you can reach when you feel good and have no asthma symptoms. General instructions  Avoid excessive exercise or activity until your asthma attack resolves. Ask your health care provider what activities are safe for you and when you can return to your normal activities.  Stay up to date on all vaccinations recommended by your health care provider, such as flu and pneumonia vaccines.  Drink enough fluid to keep your urine clear or pale yellow. Staying hydrated helps keep mucus in your lungs thin so it can be coughed up easily.  If you drink caffeine, do so in moderation.  Do not use alcohol until you have recovered.  Keep all follow-up visits as told by your health care provider. This is important. Asthma requires careful medical care, and you and your health care provider can work together to reduce the likelihood of future attacks. Contact a health care provider if:  Your peak flow reading is still at 50-79% of your  personal best after you have followed your action plan for 1 hour. This is in the yellow zone, which means "caution."  You need to use a reliever medicine more than 2-3 times a week.  Your medicines are causing side effects, such as: ? Rash. ? Itching. ? Swelling. ? Trouble breathing.  Your symptoms do not improve after 48 hours.  You cough up mucus (sputum) that is thicker than usual.  You have a fever.  You need to use your medicines much more frequently than normal. Get help right away if:  Your peak flow reading is less than 50% of your personal best. This is in the red zone, which means "danger."  You have severe trouble breathing.  You develop chest pain or discomfort.  Your medicines no longer seem to be helping.  You vomit.  You cannot eat or drink without vomiting.  You are coughing up yellow, green, brown, or bloody mucus.  You have a fever and your symptoms suddenly get worse.  You have trouble swallowing.  You feel very tired, and breathing becomes tiring. Summary  Acute bronchospasm caused by asthma is also referred to as an asthma attack.  Bronchospasm is caused by narrowing or tightness in air passages, which causes shortness of breath, coughing, and loud breathing (wheezing).  Many things can trigger an asthma   attack, such as allergens, weather changes, exercise, smoke, and other fumes.  Treatment for an asthma attack may include inhaled rescue medicines for immediate relief, as well as the use of maintenance therapy.  Get help right away if you have worsening shortness of breath, chest pain, or fever, or if your home medicines are no longer helping with your symptoms. This information is not intended to replace advice given to you by your health care provider. Make sure you discuss any questions you have with your health care provider. Document Revised: 10/30/2018 Document Reviewed: 08/12/2016 Elsevier Patient Education  2020 [employer]  

## 2019-11-19 NOTE — Progress Notes (Signed)
New Patient Office Visit  Subjective:  Patient ID: Dakota Gordon, male    DOB: Jan 14, 1981  Age: 39 y.o. MRN: [id number]  CC: No chief complaint on file.   HPI Dakota Gordon presents for acute shortness of breath  He used his nebulizer without improvement He has a cough noted  Onset a week ago due to dust in the work place No intubation because of asthma He states that his asthma was doing well prior to this. Denies lightheadedness  He states that he had his first covid vaccine 11/01/2019 He is not a smoker  Past Medical History:  Diagnosis Date  . Asthma     No past surgical history on file.  No family history on file.  Social History   Socioeconomic History  . Marital status: Single    Spouse name: Not on file  . Number of children: Not on file  . Years of education: Not on file  . Highest education level: Not on file  Occupational History  . Not on file  Tobacco Use  . Smoking status: Former Games developer  . Smokeless tobacco: Never Used  Substance and Sexual Activity  . Alcohol use: Yes  . Drug use: No  . Sexual activity: Not on file  Other Topics Concern  . Not on file  Social History Narrative  . Not on file   Social Determinants of Health   Financial Resource Strain:   . Difficulty of Paying Living Expenses:   Food Insecurity:   . Worried About Programme researcher, broadcasting/film/video in the Last Year:   . Barista in the Last Year:   Transportation Needs:   . Freight forwarder (Medical):   Marland Kitchen Lack of Transportation (Non-Medical):   Physical Activity:   . Days of Exercise per Week:   . Minutes of Exercise per Session:   Stress:   . Feeling of Stress :   Social Connections:   . Frequency of Communication with Friends and Family:   . Frequency of Social Gatherings with Friends and Family:   . Attends Religious Services:   . Active Member of Clubs or Organizations:   . Attends Banker Meetings:   Marland Kitchen Marital Status:   Intimate Partner  Violence:   . Fear of Current or Ex-Partner:   . Emotionally Abused:   Marland Kitchen Physically Abused:   . Sexually Abused:     ROS Review of Systems  Objective:   Today's Vitals: BP 122/73 (BP Location: Left Arm, Patient Position: Sitting, Cuff Size: Normal)   Pulse (!) 112   Temp (!) 97.2 F (36.2 C)   Resp 20   Ht 5\' 9"  (1.753 m)   Wt 165 lb (74.8 kg)   BMI 24.37 kg/m   Physical Exam  Physical Exam  Constitutional: Oriented to person, place, and time. Appears well-developed and well-nourished.  HENT:  Head: Normocephalic and atraumatic.  Eyes: Conjunctivae and EOM are normal.  Cardiovascular: sinus tachycardia Pulmonary/Chest: respiratory distress, tripoding, wheezing throughout Neurological: Is alert and oriented to person, place, and time.  Skin: Skin is warm. Capillary refill takes less than 2 seconds.  Psychiatric: Has a normal mood and affect. Behavior is normal. Judgment and thought content normal.   Assessment & Plan:   Problem List Items Addressed This Visit    None    Visit Diagnoses    Asthma with severe asthma attack    -  Primary     Advised pt to go to  ER Called 911 Patient transferred by paramedics   Outpatient Encounter Medications as of 11/19/2019  Medication Sig  . albuterol (VENTOLIN HFA) 108 (90 Base) MCG/ACT inhaler Inhale 2 puffs into the lungs every 3 (three) hours as needed.  . diphenhydrAMINE (BENADRYL) 25 mg capsule Take 25 mg by mouth daily at 8 pm.   No facility-administered encounter medications on file as of 11/19/2019.    Follow-up: No follow-ups on file.   Forrest Moron, MD

## 2019-11-20 ENCOUNTER — Other Ambulatory Visit: Payer: Self-pay

## 2019-11-20 ENCOUNTER — Encounter (HOSPITAL_COMMUNITY): Payer: Self-pay | Admitting: Emergency Medicine

## 2019-11-20 ENCOUNTER — Telehealth: Payer: Self-pay | Admitting: Family Medicine

## 2019-11-20 ENCOUNTER — Emergency Department (HOSPITAL_COMMUNITY)
Admission: EM | Admit: 2019-11-20 | Discharge: 2019-11-20 | Disposition: A | Discharge status: Home / Self Care | Payer: Managed Care, Other (non HMO) | Attending: Emergency Medicine | Admitting: Emergency Medicine

## 2019-11-20 ENCOUNTER — Emergency Department (HOSPITAL_COMMUNITY): Payer: Managed Care, Other (non HMO)

## 2019-11-20 DIAGNOSIS — R0602 Shortness of breath: Secondary | ICD-10-CM | POA: Diagnosis present

## 2019-11-20 DIAGNOSIS — R071 Chest pain on breathing: Secondary | ICD-10-CM | POA: Insufficient documentation

## 2019-11-20 DIAGNOSIS — J4552 Severe persistent asthma with status asthmaticus: Secondary | ICD-10-CM | POA: Diagnosis not present

## 2019-11-20 DIAGNOSIS — Z20822 Contact with and (suspected) exposure to covid-19: Secondary | ICD-10-CM | POA: Insufficient documentation

## 2019-11-20 DIAGNOSIS — R0789 Other chest pain: Secondary | ICD-10-CM | POA: Diagnosis not present

## 2019-11-20 LAB — CBC WITH DIFFERENTIAL/PLATELET
Abs Immature Granulocytes: 0.02 10*3/uL (ref 0.00–0.07)
Basophils Absolute: 0.1 10*3/uL (ref 0.0–0.1)
Basophils Relative: 1 %
Eosinophils Absolute: 0.5 10*3/uL (ref 0.0–0.5)
Eosinophils Relative: 6 %
HCT: 49 % (ref 39.0–52.0)
Hemoglobin: 16.5 g/dL (ref 13.0–17.0)
Immature Granulocytes: 0 %
Lymphocytes Relative: 31 %
Lymphs Abs: 2.6 10*3/uL (ref 0.7–4.0)
MCH: 30.4 pg (ref 26.0–34.0)
MCHC: 33.7 g/dL (ref 30.0–36.0)
MCV: 90.4 fL (ref 80.0–100.0)
Monocytes Absolute: 0.8 10*3/uL (ref 0.1–1.0)
Monocytes Relative: 10 %
Neutro Abs: 4.5 10*3/uL (ref 1.7–7.7)
Neutrophils Relative %: 52 %
Platelets: 282 10*3/uL (ref 150–400)
RBC: 5.42 MIL/uL (ref 4.22–5.81)
RDW: 13.4 % (ref 11.5–15.5)
WBC: 8.4 10*3/uL (ref 4.0–10.5)
nRBC: 0 % (ref 0.0–0.2)

## 2019-11-20 LAB — RAPID URINE DRUG SCREEN, HOSP PERFORMED
Amphetamines: NOT DETECTED
Barbiturates: NOT DETECTED
Benzodiazepines: POSITIVE — AB
Cocaine: NOT DETECTED
Opiates: NOT DETECTED
Tetrahydrocannabinol: POSITIVE — AB

## 2019-11-20 LAB — BASIC METABOLIC PANEL
Anion gap: 8 (ref 5–15)
BUN: 17 mg/dL (ref 6–20)
CO2: 24 mmol/L (ref 22–32)
Calcium: 9.4 mg/dL (ref 8.9–10.3)
Chloride: 105 mmol/L (ref 98–111)
Creatinine, Ser: 1.04 mg/dL (ref 0.61–1.24)
GFR calc Af Amer: >60 mL/min (ref >60)
GFR calc non Af Amer: >60 mL/min (ref >60)
Glucose, Bld: 94 mg/dL (ref 70–99)
Potassium: 4.1 mmol/L (ref 3.5–5.1)
Sodium: 137 mmol/L (ref 135–145)

## 2019-11-20 LAB — POC SARS CORONAVIRUS 2 AG -  ED: SARS Coronavirus 2 Ag: NEGATIVE

## 2019-11-20 LAB — D-DIMER, QUANTITATIVE: D-Dimer, Quant: <0.27 ug/mL-FEU (ref 0.00–0.50)

## 2019-11-20 LAB — TSH: TSH: 0.887 u[IU]/mL (ref 0.350–4.500)

## 2019-11-20 LAB — SARS CORONAVIRUS 2 (TAT 6-24 HRS): SARS Coronavirus 2: NEGATIVE

## 2019-11-20 LAB — BRAIN NATRIURETIC PEPTIDE: B Natriuretic Peptide: 46.3 pg/mL (ref 0.0–100.0)

## 2019-11-20 MED ORDER — CETIRIZINE-PSEUDOEPHEDRINE ER 5-120 MG PO TB12: 1.0000 | ORAL_TABLET | Freq: Every day | ORAL | 0 refills | Status: AC

## 2019-11-20 MED ORDER — ALBUTEROL SULFATE HFA 108 (90 BASE) MCG/ACT IN AERS: 1.0000 | INHALATION_SPRAY | Freq: Four times a day (QID) | RESPIRATORY_TRACT | 0 refills | Status: AC | PRN

## 2019-11-20 MED ORDER — DEXAMETHASONE SODIUM PHOSPHATE 10 MG/ML IJ SOLN
10.0000 mg | Freq: Once | INTRAMUSCULAR | Status: AC
  Administered 2019-11-20: 10 mg via INTRAVENOUS
  Filled 2019-11-20: qty 1

## 2019-11-20 MED ORDER — ALBUTEROL (5 MG/ML) CONTINUOUS INHALATION SOLN
10.0000 mg/h | INHALATION_SOLUTION | Freq: Once | RESPIRATORY_TRACT | Status: AC
  Administered 2019-11-20: 10 mg/h via RESPIRATORY_TRACT
  Filled 2019-11-20: qty 20

## 2019-11-20 MED ORDER — IPRATROPIUM BROMIDE 0.02 % IN SOLN
0.5000 mg | Freq: Once | RESPIRATORY_TRACT | Status: AC
  Administered 2019-11-20: 16:00:00 0.5 mg via RESPIRATORY_TRACT
  Filled 2019-11-20: qty 2.5

## 2019-11-20 MED ORDER — CETIRIZINE HCL 5 MG/5ML PO SOLN: 5.0000 mg | Freq: Once | ORAL | Status: DC

## 2019-11-20 MED ORDER — ALBUTEROL SULFATE HFA 108 (90 BASE) MCG/ACT IN AERS: 4.0000 | INHALATION_SPRAY | Freq: Once | RESPIRATORY_TRACT | Status: DC

## 2019-11-20 MED ORDER — ALBUTEROL SULFATE HFA 108 (90 BASE) MCG/ACT IN AERS: 6.0000 | INHALATION_SPRAY | Freq: Once | RESPIRATORY_TRACT | Status: DC

## 2019-11-20 MED ORDER — IPRATROPIUM BROMIDE HFA 17 MCG/ACT IN AERS: 2.0000 | INHALATION_SPRAY | Freq: Once | RESPIRATORY_TRACT | Status: DC

## 2019-11-20 MED ORDER — DEXAMETHASONE 0.75 MG PO TABS
ORAL_TABLET | ORAL | 0 refills | Status: AC
Start: 2019-11-21 — End: ?

## 2019-11-20 MED ORDER — MAGNESIUM SULFATE 2 GM/50ML IV SOLN
2.0000 g | Freq: Once | INTRAVENOUS | Status: AC
  Administered 2019-11-20: 15:00:00 2 g via INTRAVENOUS
  Filled 2019-11-20: qty 50

## 2019-11-20 MED ORDER — CETIRIZINE HCL 10 MG PO TABS
5.0000 mg | ORAL_TABLET | Freq: Once | ORAL | Status: AC
  Administered 2019-11-20: 15:00:00 5 mg via ORAL
  Filled 2019-11-20 (×2): qty 1

## 2019-11-20 NOTE — Telephone Encounter (Signed)
Left message to have patient call back to see how his asthma is doing. If he did not go to UC or ER then I will send in prednisone and another med.

## 2019-11-20 NOTE — ED Triage Notes (Signed)
Pt reports that for week had SOB. Reports yesterday was given neb treatment by EMS and felt better. Reports cough and has asthma but inhalers arent helping.

## 2019-11-20 NOTE — ED Provider Notes (Addendum)
[facility] DEPT Provider Note   CSN: [id number] Arrival date & time: 11/20/19  0913     History Chief Complaint  Patient presents with  . Shortness of Breath  . Cough  . Asthma    Dakota Gordon is a 39 y.o. male.  HPI    39 year old male comes in a chief complaint of shortness of breath.  Patient has history of asthma.  He reports that over the last 3 weeks or so he has noted that he gets fatigued easily.  He is also started noticing episodes of shortness of breath over the last couple of days with exertion.  Yesterday he felt well because he got short of breath while at work.  He went to an urgent care where he was told that he was having asthma exacerbation, he was advised to come to the ER but he decided against that.  He comes in today because his symptoms have not improved and he gets short of breath easily.  Patient has not heard any wheezing.  He does have a history of asthma, but his current symptoms feel different.  He has chest pain that is worse with deep inspiration.  Pt has no hx of PE, DVT and denies any exogenous hormone (testosterone / estrogen) use, long distance travels or surgery in the past 6 weeks, active cancer, recent immobilization.   Patient also denies any blood loss, substance abuse.   Past Medical History:  Diagnosis Date  . Asthma     Patient Active Problem List   Diagnosis Date Noted  . 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 Involuntary commitment 08/26/2019    History reviewed. No pertinent surgical history.     No family history on file.  Social History   Tobacco Use  . Smoking status: Former Research scientist (life sciences)  . Smokeless tobacco: Never Used  Substance Use Topics  . Alcohol use: Yes  . Drug use: No    Home Medications Prior to Admission medications   Medication Sig Start Date End Date Taking? Authorizing Provider  albuterol (VENTOLIN HFA) 108 (90 Base)  MCG/ACT inhaler Inhale 2 puffs into the lungs every 6 (six) hours as needed for wheezing or shortness of breath. 11/19/19  Yes Stallings, Zoe A, MD  diphenhydrAMINE (BENADRYL) 25 mg capsule Take 25-75 mg by mouth daily as needed for allergies.    Yes [provider]  albuterol (VENTOLIN HFA) 108 (90 Base) MCG/ACT inhaler Inhale 1-2 puffs into the lungs every 6 (six) hours as needed for wheezing or shortness of breath. 11/20/19   Varney Biles, MD  cetirizine-pseudoephedrine (ZYRTEC-D) 5-120 MG tablet Take 1 tablet by mouth daily. 11/20/19   Varney Biles, MD  dexamethasone (DECADRON) 0.75 MG tablet On day 1 and 2 take 2 tablets BID On day 3 and 4 take 1 tablet BID On day 5 and 6 take 1 tablet QD 11/21/19   Varney Biles, MD    Allergies    Prednisone  Review of Systems   Review of Systems  Constitutional: Positive for activity change and fatigue.  Respiratory: Positive for cough and shortness of breath.   Cardiovascular: Positive for chest pain.  Gastrointestinal: Negative for nausea and vomiting.  Musculoskeletal: Negative for gait problem.  Neurological: Negative for syncope.    Physical Exam Updated Vital Signs BP (!) 145/102   Pulse 82   Temp 97.9 F (36.6 C) (Oral)   Resp 13   Ht 5\' 9"  (1.753 m)  Wt 74.8 kg   SpO2 97%   BMI 24.37 kg/m   Physical Exam Vitals and nursing note reviewed.  Constitutional:      Appearance: He is well-developed.  HENT:     Head: Atraumatic.  Cardiovascular:     Rate and Rhythm: Normal rate.  Pulmonary:     Effort: Pulmonary effort is normal.     Breath sounds: No decreased breath sounds, wheezing, rhonchi or rales.  Musculoskeletal:     Cervical back: Neck supple.     Right lower leg: No edema.     Left lower leg: No edema.  Skin:    General: Skin is warm.  Neurological:     Mental Status: He is alert and oriented to person, place, and time.     ED Results / Procedures / Treatments   Labs (all labs ordered are  listed, but only abnormal results are displayed) Labs Reviewed  RAPID URINE DRUG SCREEN, HOSP PERFORMED - Abnormal; Notable for the following components:      Result Value   Benzodiazepines POSITIVE (*)    Tetrahydrocannabinol POSITIVE (*)    All other components within normal limits  SARS CORONAVIRUS 2 (TAT 6-24 HRS)  BASIC METABOLIC PANEL  CBC WITH DIFFERENTIAL/PLATELET  D-DIMER, QUANTITATIVE (NOT AT [facility])  BRAIN NATRIURETIC PEPTIDE  TSH  POC SARS CORONAVIRUS 2 AG -  ED    EKG None  Radiology DG Chest 2 View  Result Date: 11/20/2019 CLINICAL DATA:  SOB and cough EXAM: CHEST - 2 VIEW COMPARISON:  None FINDINGS: The heart size and mediastinal contours are within normal limits. Both lungs are clear. The visualized skeletal structures are unremarkable. IMPRESSION: No active cardiopulmonary disease. Electronically Signed   By: Signa Kell M.D.   On: 11/20/2019 10:01    Procedures .Critical Care Performed by: Derwood Kaplan, MD Authorized by: Derwood Kaplan, MD   Critical care provider statement:    Critical care time (minutes):  52   Critical care was necessary to treat or prevent imminent or life-threatening deterioration of the following conditions:  Respiratory failure   Critical care was time spent personally by me on the following activities:  Discussions with consultants, evaluation of patient's response to treatment, examination of patient, ordering and performing treatments and interventions, ordering and review of laboratory studies, ordering and review of radiographic studies, pulse oximetry, re-evaluation of patient's condition, obtaining history from patient or surrogate and review of old charts   (including critical care time)  Medications Ordered in ED Medications  magnesium sulfate IVPB 2 g 50 mL (2 g Intravenous New Bag/Given 11/20/19 1529)  cetirizine (ZYRTEC) tablet 5 mg (5 mg Oral Given 11/20/19 1515)  dexamethasone (DECADRON) injection 10 mg (10 mg  Intravenous Given 11/20/19 1520)  albuterol (PROVENTIL,VENTOLIN) solution continuous neb (10 mg/hr Nebulization Given 11/20/19 1530)  ipratropium (ATROVENT) nebulizer solution 0.5 mg (0.5 mg Nebulization Given 11/20/19 1530)    ED Course  I have reviewed the triage vital signs and the nursing notes.  Pertinent labs & imaging results that were available during my care of the patient were reviewed by me and considered in my medical decision making (see chart for details).  Clinical Course as of Nov 19 1613  Wed Nov 20, 2019  1506 Labs are reassuring.  Upon ambulation, patient did well.  His O2 sats remained over 92 and heart rate was 105.  On reassessment however patient is wheezing again.  He reports worsening of the cough.  We will give him nebulizer treatment,  magnesium.  This seems like severe asthma exacerbation.  Patient will require work note.  Patient will need reassessment prior to being discharged.  If he continues to do unwell then he might need admission.   [AN]  1615 The patient appears reasonably screened and/or stabilized for discharge and I doubt any other medical condition or other [facility] requiring further screening, evaluation, or treatment in the ED at this time prior to discharge.  Results from the ER workup discussed with the patient face to face and all questions answered to the best of my ability. The patient is safe for discharge with strict return precautions.     [AN]    Clinical Course User Index [AN] Derwood Kaplan, MD   MDM Rules/Calculators/A&P                      Patient comes in a chief complaint of shortness of breath. He is having pleuritic chest pain.  He has no risk factors for PE, DVT.  However, with pleuritic chest pain, PE is in our differential diagnosis and D-dimer has been ordered.  Patient's pulmonary exam is reassuring.  Differential diagnosis includes pericarditis, PE, bronchitis. Asthma exacerbation unlikely at this time.  Pneumonia also  considered, but chest x-ray is clear.  Final Clinical Impression(s) / ED Diagnoses Final diagnoses:  Severe persistent asthma with status asthmaticus    Rx / DC Orders ED Discharge Orders         Ordered    dexamethasone (DECADRON) 0.75 MG tablet     11/20/19 1613    albuterol (VENTOLIN HFA) 108 (90 Base) MCG/ACT inhaler  Every 6 hours PRN     11/20/19 1613    cetirizine-pseudoephedrine (ZYRTEC-D) 5-120 MG tablet  Daily     11/20/19 1613              Derwood Kaplan, MD 11/20/19 1509    Derwood Kaplan, MD 11/20/19 1615

## 2019-11-20 NOTE — ED Notes (Signed)
I made patient aware that we needed a urine sample 

## 2019-11-20 NOTE — Discharge Instructions (Addendum)
We saw you in the ER for your asthma related complains. We gave you some breathing treatments in the ER, and seems like your symptoms have improved. Please take albuterol as needed every 4 hours. Please take the medications prescribed. Please refrain from smoking or smoke exposure. Please see a primary care doctor in 1 week. Return to the ER if your symptoms worsen.  

## 2019-11-20 NOTE — ED Notes (Addendum)
I walked patient in the hallway patient was coughing a lot and was wheezing.  Patient oxygen level was 97 percent and heart rate was 102.  I made MD aware.

## 2019-11-25 ENCOUNTER — Ambulatory Visit: Payer: Managed Care, Other (non HMO) | Attending: Internal Medicine

## 2019-11-25 DIAGNOSIS — Z23 Encounter for immunization: Secondary | ICD-10-CM

## 2019-11-25 NOTE — Progress Notes (Signed)
   Covid-19 Vaccination Clinic  Name:  Dakota Gordon    MRN: [id number] DOB: Mar 09, 1981  11/25/2019  [name] was observed post Covid-19 immunization for 15 minutes without incident. He was provided with Vaccine Information Sheet and instruction to access the V-Safe system.   [name] was instructed to call 911 with any severe reactions post vaccine: Marland Kitchen Difficulty breathing  . Swelling of face and throat  . A fast heartbeat  . A bad rash all over body  . Dizziness and weakness   Immunizations Administered    Name Date Dose VIS Date Route   Pfizer COVID-19 Vaccine 11/25/2019 11:29 AM 0.3 mL 09/18/2018 Intramuscular   Manufacturer: [employer], [employer]   Lot: [id number]   NDC: [address]-3

## 2019-12-13 ENCOUNTER — Institutional Professional Consult (permissible substitution): Payer: Managed Care, Other (non HMO) | Admitting: Emergency Medicine

## 2020-01-21 ENCOUNTER — Other Ambulatory Visit: Payer: Self-pay

## 2020-01-21 ENCOUNTER — Encounter (HOSPITAL_COMMUNITY): Payer: Self-pay

## 2020-01-21 ENCOUNTER — Emergency Department (HOSPITAL_COMMUNITY)
Admission: EM | Admit: 2020-01-21 | Discharge: 2020-01-22 | Disposition: A | Discharge status: Home / Self Care | Payer: Managed Care, Other (non HMO) | Attending: Emergency Medicine | Admitting: Emergency Medicine

## 2020-01-21 DIAGNOSIS — T50901A Poisoning by unspecified drugs, medicaments and biological substances, accidental (unintentional), initial encounter: Secondary | ICD-10-CM

## 2020-01-21 DIAGNOSIS — F1210 Cannabis abuse, uncomplicated: Secondary | ICD-10-CM | POA: Insufficient documentation

## 2020-01-21 DIAGNOSIS — Z87891 Personal history of nicotine dependence: Secondary | ICD-10-CM | POA: Insufficient documentation

## 2020-01-21 DIAGNOSIS — F1410 Cocaine abuse, uncomplicated: Secondary | ICD-10-CM | POA: Insufficient documentation

## 2020-01-21 DIAGNOSIS — F1310 Sedative, hypnotic or anxiolytic abuse, uncomplicated: Secondary | ICD-10-CM | POA: Insufficient documentation

## 2020-01-21 DIAGNOSIS — F1510 Other stimulant abuse, uncomplicated: Secondary | ICD-10-CM | POA: Insufficient documentation

## 2020-01-21 DIAGNOSIS — J45909 Unspecified asthma, uncomplicated: Secondary | ICD-10-CM | POA: Insufficient documentation

## 2020-01-21 LAB — CBC WITH DIFFERENTIAL/PLATELET
Abs Immature Granulocytes: 0.11 10*3/uL — ABNORMAL HIGH (ref 0.00–0.07)
Basophils Absolute: 0.1 10*3/uL (ref 0.0–0.1)
Basophils Relative: 0 %
Eosinophils Absolute: 0.2 10*3/uL (ref 0.0–0.5)
Eosinophils Relative: 2 %
HCT: 45 % (ref 39.0–52.0)
Hemoglobin: 15.1 g/dL (ref 13.0–17.0)
Immature Granulocytes: 1 %
Lymphocytes Relative: 20 %
Lymphs Abs: 2.3 10*3/uL (ref 0.7–4.0)
MCH: 30.8 pg (ref 26.0–34.0)
MCHC: 33.6 g/dL (ref 30.0–36.0)
MCV: 91.8 fL (ref 80.0–100.0)
Monocytes Absolute: 0.8 10*3/uL (ref 0.1–1.0)
Monocytes Relative: 7 %
Neutro Abs: 8 10*3/uL — ABNORMAL HIGH (ref 1.7–7.7)
Neutrophils Relative %: 70 %
Platelets: 293 10*3/uL (ref 150–400)
RBC: 4.9 MIL/uL (ref 4.22–5.81)
RDW: 13.9 % (ref 11.5–15.5)
WBC: 11.4 10*3/uL — ABNORMAL HIGH (ref 4.0–10.5)
nRBC: 0 % (ref 0.0–0.2)

## 2020-01-21 LAB — BASIC METABOLIC PANEL
Anion gap: 14 (ref 5–15)
BUN: 19 mg/dL (ref 6–20)
CO2: 23 mmol/L (ref 22–32)
Calcium: 9.2 mg/dL (ref 8.9–10.3)
Chloride: 106 mmol/L (ref 98–111)
Creatinine, Ser: 1.53 mg/dL — ABNORMAL HIGH (ref 0.61–1.24)
GFR calc Af Amer: >60 mL/min (ref >60)
GFR calc non Af Amer: 56 mL/min — ABNORMAL LOW (ref >60)
Glucose, Bld: 276 mg/dL — ABNORMAL HIGH (ref 70–99)
Potassium: 4.3 mmol/L (ref 3.5–5.1)
Sodium: 143 mmol/L (ref 135–145)

## 2020-01-21 MED ORDER — SODIUM CHLORIDE 0.9 % IV BOLUS
1000.0000 mL | Freq: Once | INTRAVENOUS | Status: AC
  Administered 2020-01-21: 1000 mL via INTRAVENOUS

## 2020-01-21 NOTE — ED Notes (Signed)
2L [place] applied to patient.

## 2020-01-21 NOTE — ED Triage Notes (Signed)
Accidental overdose after taking "pills". Pt not elaborating on which type. Pt found by roommate after shower running for extended period of time. 2mg  narcan intranasally given after found breathing 4-6 breaths per minute.

## 2020-01-21 NOTE — ED Provider Notes (Signed)
Care assumed from [place] Robinson, PA-C, at shift change, please see their notes for full documentation of patient's complaint/HPI. Briefly, pt here with accidental overdose of unknown substance. Awaiting UDS. Plan is to let patient metabolize and reassess after vitals improve.   Physical Exam  BP (!) 146/101   Pulse (!) 114   Temp (!) 97.4 F (36.3 C) (Oral)   Resp 17   Ht 5\' 9"  (1.753 m)   Wt 75 kg   SpO2 96%   BMI 24.42 kg/m   Physical Exam Vitals and nursing note reviewed.  Constitutional:      Appearance: He is not ill-appearing.  HENT:     Head: Normocephalic and atraumatic.  Eyes:     Conjunctiva/sclera: Conjunctivae normal.  Cardiovascular:     Rate and Rhythm: Regular rhythm. Tachycardia present.  Pulmonary:     Effort: Pulmonary effort is normal.     Breath sounds: Normal breath sounds. No wheezing, rhonchi or rales.  Skin:    General: Skin is warm and dry.     Coloration: Skin is not jaundiced.  Neurological:     Mental Status: He is alert.     ED Course/Procedures     Procedures  Results for orders placed or performed during the hospital encounter of 01/21/20  CBC with Differential  Result Value Ref Range   WBC 11.4 (H) 4.0 - 10.5 K/uL   RBC 4.90 4.22 - 5.81 MIL/uL   Hemoglobin 15.1 13.0 - 17.0 g/dL   HCT 01/23/20 39 - 52 %   MCV 91.8 80.0 - 100.0 fL   MCH 30.8 26.0 - 34.0 pg   MCHC 33.6 30.0 - 36.0 g/dL   RDW 62.1 30.8 - 65.7 %   Platelets 293 150 - 400 K/uL   nRBC 0.0 0.0 - 0.2 %   Neutrophils Relative % 70 %   Neutro Abs 8.0 (H) 1.7 - 7.7 K/uL   Lymphocytes Relative 20 %   Lymphs Abs 2.3 0.7 - 4.0 K/uL   Monocytes Relative 7 %   Monocytes Absolute 0.8 0 - 1 K/uL   Eosinophils Relative 2 %   Eosinophils Absolute 0.2 0 - 0 K/uL   Basophils Relative 0 %   Basophils Absolute 0.1 0 - 0 K/uL   Immature Granulocytes 1 %   Abs Immature Granulocytes 0.11 (H) 0.00 - 0.07 K/uL  Basic metabolic panel  Result Value Ref Range   Sodium 143 135 - 145  mmol/L   Potassium 4.3 3.5 - 5.1 mmol/L   Chloride 106 98 - 111 mmol/L   CO2 23 22 - 32 mmol/L   Glucose, Bld 276 (H) 70 - 99 mg/dL   BUN 19 6 - 20 mg/dL   Creatinine, Ser 84.6 (H) 0.61 - 1.24 mg/dL   Calcium 9.2 8.9 - 9.62 mg/dL   GFR calc non Af Amer 56 (L) >60 mL/min   GFR calc Af Amer >60 >60 mL/min   Anion gap 14 5 - 15  Urine rapid drug screen (hosp performed)  Result Value Ref Range   Opiates NONE DETECTED NONE DETECTED   Cocaine POSITIVE (A) NONE DETECTED   Benzodiazepines POSITIVE (A) NONE DETECTED   Amphetamines POSITIVE (A) NONE DETECTED   Tetrahydrocannabinol POSITIVE (A) NONE DETECTED   Barbiturates NONE DETECTED NONE DETECTED  Ethanol  Result Value Ref Range   Alcohol, Ethyl (B) <10 <10 mg/dL   DG Chest Port 1 View  Result Date: 01/22/2020 CLINICAL DATA:  Shortness of breath EXAM: PORTABLE CHEST  1 VIEW COMPARISON:  11/20/2019 FINDINGS: Low volume chest with perihilar atelectasis. There is no edema, consolidation, effusion, or pneumothorax. Normal heart size and mediastinal contours when allowing for rotation and low volumes. IMPRESSION: Low volume chest with perihilar atelectasis. Electronically Signed   By: Marnee Spring M.D.   On: 01/22/2020 04:15    MDM  CXR ordered as pt became hypoxic during ED stay and was placed on supplemental O2.  X-ray shows some atelectasis however no other findings. Will attempt to wean off of O2.   UDS has returned positive for cocaine, benzos, amphetamines, and THC.   After 2 L fluid bolus pt continued to be tachycardic. Have discussed case with attending physician Dr. Read Drivers who recommends labetalol given positive cocaine. Have ordered. Will reassess.   HR has improved after Labetalol. Pt taken off of O2 and ambulated in the ED - O2 remained at 94-95% on RA and pt steady on his feet after 8 hours in the ED. Feel he is stable for discharge home at this time. Pt instructed to return to the ED for any worsening symptoms. He is in  agreement with plan and stable for discharge home.   This note was prepared using Dragon voice recognition software and may include unintentional dictation errors due to the inherent limitations of voice recognition software.        Tanda Rockers, PA-C 01/22/20 0655    Molpus, Jonny Ruiz, MD 01/22/20 [phone]

## 2020-01-21 NOTE — ED Provider Notes (Signed)
[facility]-EMERGENCY DEPT Provider Note   CSN: [id number] Arrival date & time: 01/21/20  2205     History Chief Complaint  Patient presents with  . Drug Overdose    Dakota Gordon is a 39 y.o. male brought into the ED by EMS after accidental overdose.  He states he thought he was simply taking a pill to help with his back pain and help him sleep.  He states he is unsure of what was in the pill though thinks it may be fentanyl.  In addition to taking this pill, he had also been using alcohol, taking a Xanax and smoked marijuana.  He states this was not an intentional overdose.  He plan to take the medicine so he get some sleep and go to work in the morning.  It is reported he was found by his roommate in the shower.  2 mg of Narcan was given which improved his slow respirations.  He has smoked the same marijuana before and has not had any adverse effects; does not believe it was laced with any additional drugs.  No daily medications.  The history is provided by the patient.       Past Medical History:  Diagnosis Date  . Asthma     Patient Active Problem List   Diagnosis Date Noted  . 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 Involuntary commitment 08/26/2019    History reviewed. No pertinent surgical history.     No family history on file.  Social History   Tobacco Use  . Smoking status: Former Games developer  . Smokeless tobacco: Never Used  Substance Use Topics  . Alcohol use: Yes  . Drug use: No    Home Medications Prior to Admission medications   Medication Sig Start Date End Date Taking? Authorizing Provider  albuterol (VENTOLIN HFA) 108 (90 Base) MCG/ACT inhaler Inhale 2 puffs into the lungs every 6 (six) hours as needed for wheezing or shortness of breath. 11/19/19   Doristine Bosworth, MD  albuterol (VENTOLIN HFA) 108 (90 Base) MCG/ACT inhaler Inhale 1-2 puffs into the lungs every 6 (six) hours as needed  for wheezing or shortness of breath. 11/20/19   Derwood Kaplan, MD  cetirizine-pseudoephedrine (ZYRTEC-D) 5-120 MG tablet Take 1 tablet by mouth daily. 11/20/19   Derwood Kaplan, MD  dexamethasone (DECADRON) 0.75 MG tablet On day 1 and 2 take 2 tablets BID On day 3 and 4 take 1 tablet BID On day 5 and 6 take 1 tablet QD 11/21/19   Derwood Kaplan, MD  diphenhydrAMINE (BENADRYL) 25 mg capsule Take 25-75 mg by mouth daily as needed for allergies.     [provider]    Allergies    Prednisone  Review of Systems   Review of Systems  All other systems reviewed and are negative.   Physical Exam Updated Vital Signs BP (!) 153/108   Pulse (!) 137   Resp (!) 24   Ht 5\' 9"  (1.753 m)   Wt 75 kg   SpO2 97%   BMI 24.42 kg/m   Physical Exam Vitals and nursing note reviewed.  Constitutional:      General: He is not in acute distress.    Appearance: He is well-developed.  HENT:     Head: Normocephalic and atraumatic.  Eyes:     Conjunctiva/sclera: Conjunctivae normal.     Comments: Constricted pupils  Cardiovascular:     Rate and Rhythm: Regular rhythm. Tachycardia present.  Pulmonary:     Effort: Pulmonary effort is normal. No respiratory distress.     Breath sounds: Normal breath sounds.  Abdominal:     General: Bowel sounds are normal.     Palpations: Abdomen is soft.     Tenderness: There is no abdominal tenderness.  Skin:    General: Skin is warm.  Neurological:     Mental Status: He is alert.  Psychiatric:        Behavior: Behavior normal.     ED Results / Procedures / Treatments   Labs (all labs ordered are listed, but only abnormal results are displayed) Labs Reviewed  CBC WITH DIFFERENTIAL/PLATELET  BASIC METABOLIC PANEL  RAPID URINE DRUG SCREEN, HOSP PERFORMED    EKG None  Radiology No results found.  Procedures Procedures (including critical care time)  Medications Ordered in ED Medications  sodium chloride 0.9 % bolus 1,000 mL (has no  administration in time range)    ED Course  I have reviewed the triage vital signs and the nursing notes.  Pertinent labs & imaging results that were available during my care of the patient were reviewed by me and considered in my medical decision making (see chart for details).    MDM Rules/Calculators/A&P                          Patient presenting via EMS after reported unintentional overdose on unknown substance.  He reports he was taking a pill to help back pain and help him sleep, in addition to Xanax, alcohol and marijuana.  He thinks it may have been a fentanyl though he is unsure.  Narcan was administered with good response.  On evaluation he is alert and conversant.  Pupils are constricted.  He is tachycardic in the 120s - sinus, normal rhythm.   IV fluids ordered.  Labs, UDS ordered. Will monitor.   HR improving to 109. RN reports slight hypoxia to low 90s, improved on [place]. Pt didn't want to elaborate, however states he has been having difficulty breathing for some time?  Patient denies any vomiting, EMS also did not report any vomiting.  Will obtain chest x-ray as a precaution.  Continue to monitor.  Care assumed at shift change by [facility].   Final Clinical Impression(s) / ED Diagnoses Final diagnoses:  None    Rx / DC Orders ED Discharge Orders    None       Myeshia Fojtik, [place] N, PA-C 01/21/20 2348    Raeford Razor, MD 01/27/20 [phone]

## 2020-01-22 ENCOUNTER — Emergency Department (HOSPITAL_COMMUNITY): Payer: Managed Care, Other (non HMO)

## 2020-01-22 LAB — RAPID URINE DRUG SCREEN, HOSP PERFORMED
Amphetamines: POSITIVE — AB
Barbiturates: NOT DETECTED
Benzodiazepines: POSITIVE — AB
Cocaine: POSITIVE — AB
Opiates: NOT DETECTED
Tetrahydrocannabinol: POSITIVE — AB

## 2020-01-22 LAB — ETHANOL: Alcohol, Ethyl (B): <10 mg/dL (ref <10)

## 2020-01-22 MED ORDER — LABETALOL HCL 5 MG/ML IV SOLN
10.0000 mg | Freq: Once | INTRAVENOUS | Status: AC
  Administered 2020-01-22: 10 mg via INTRAVENOUS
  Filled 2020-01-22: qty 4

## 2020-01-22 MED ORDER — ONDANSETRON HCL 4 MG/2ML IJ SOLN
4.0000 mg | Freq: Once | INTRAMUSCULAR | Status: DC
  Filled 2020-01-22: qty 2

## 2020-01-22 NOTE — Discharge Instructions (Addendum)
Please follow up with your PCP regarding ED visit today - if you do not have a PCP there is a [phone] number on the last page of the discharge paperwork that will help find one who takes your insurance Refrain from taking unknown substances from individuals in the future Return to the ED for any worsening symptoms

## 2020-01-22 NOTE — ED Notes (Signed)
Pt ambulated in hallways. o2 maintained 95% on room air.

## 2021-08-14 IMAGING — CR DG CHEST 2V
2 series · 2 of 2 positions shown · non-contrast
Comparison: None

CLINICAL DATA: SOB and cough

EXAM:
CHEST - 2 VIEW

[w chest pa]
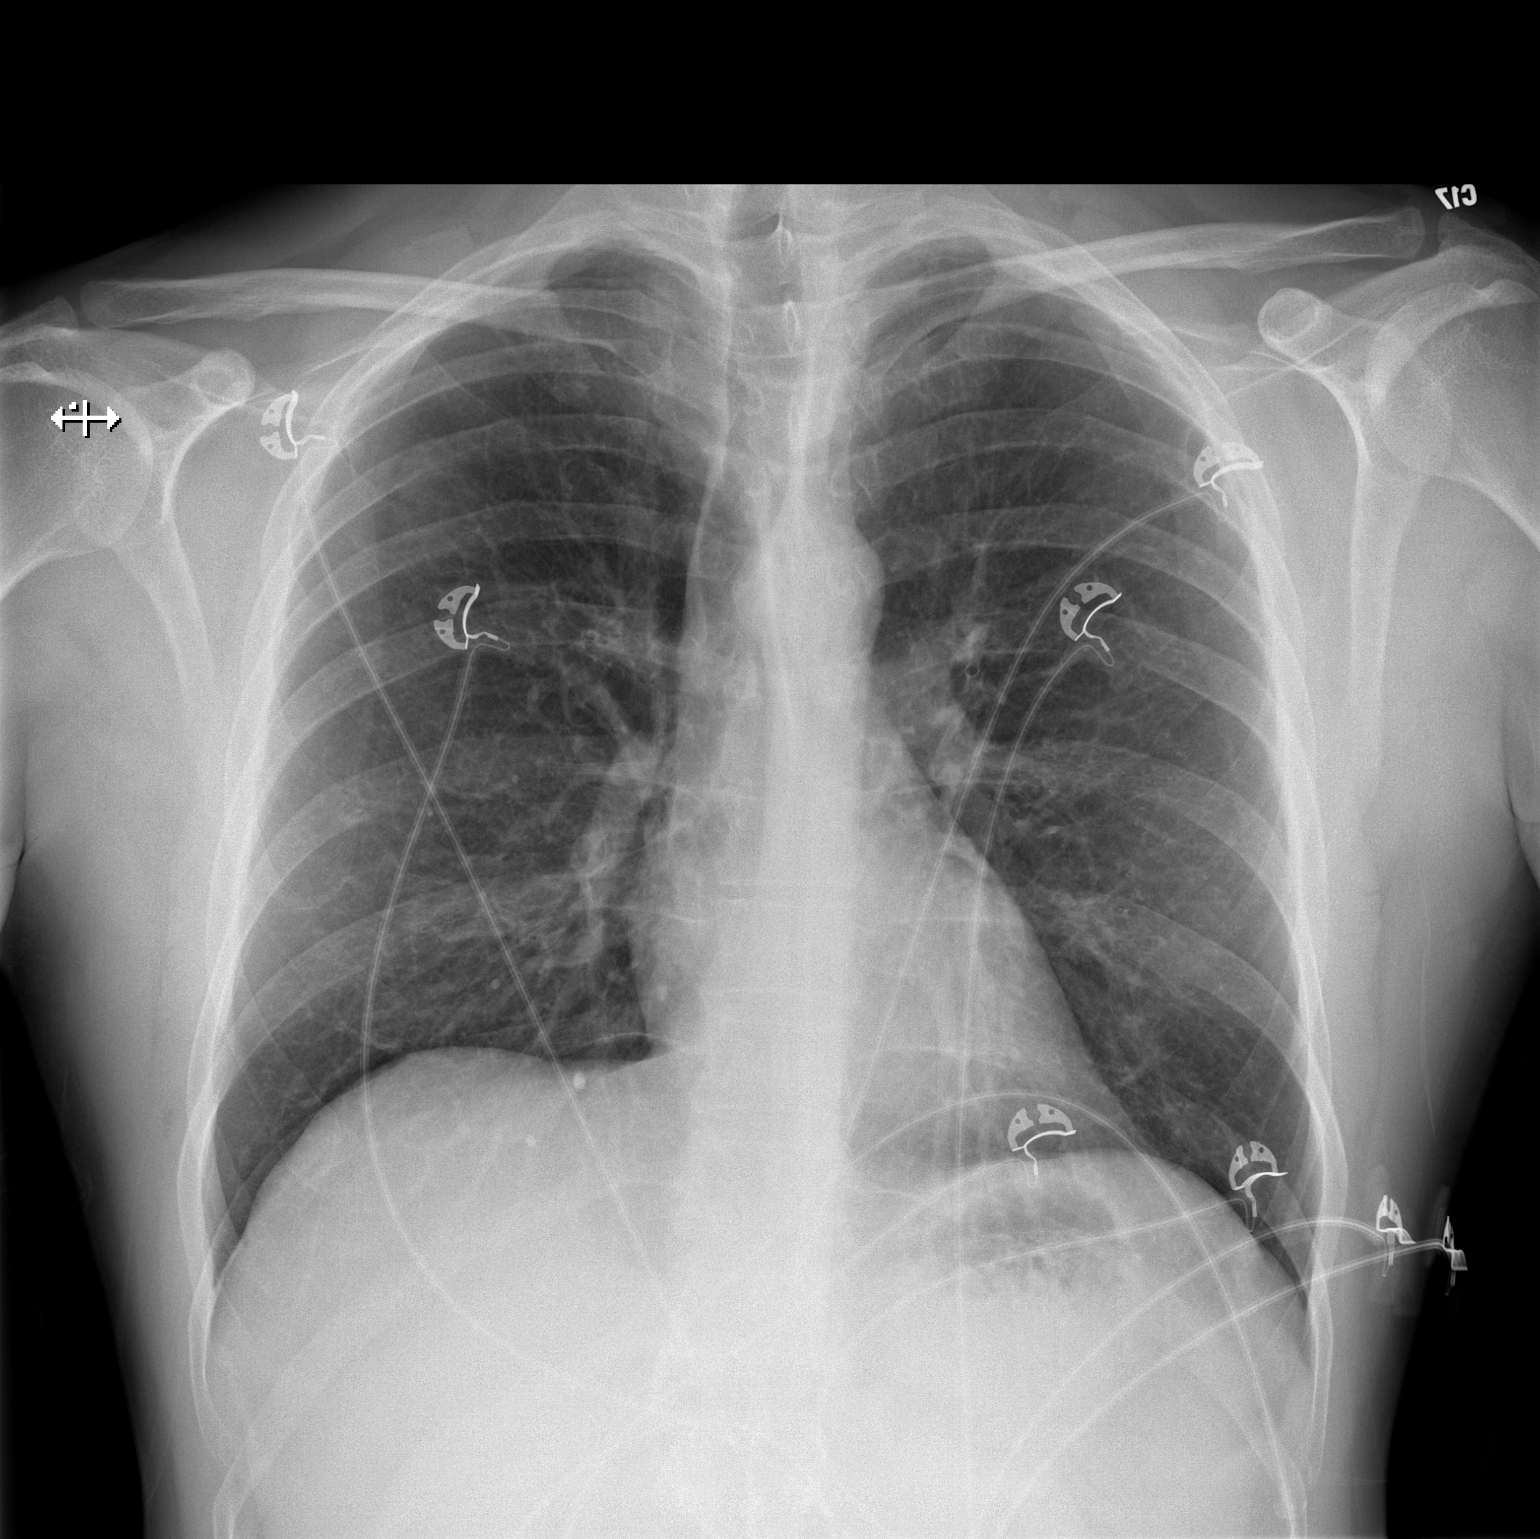

[w chest lat]
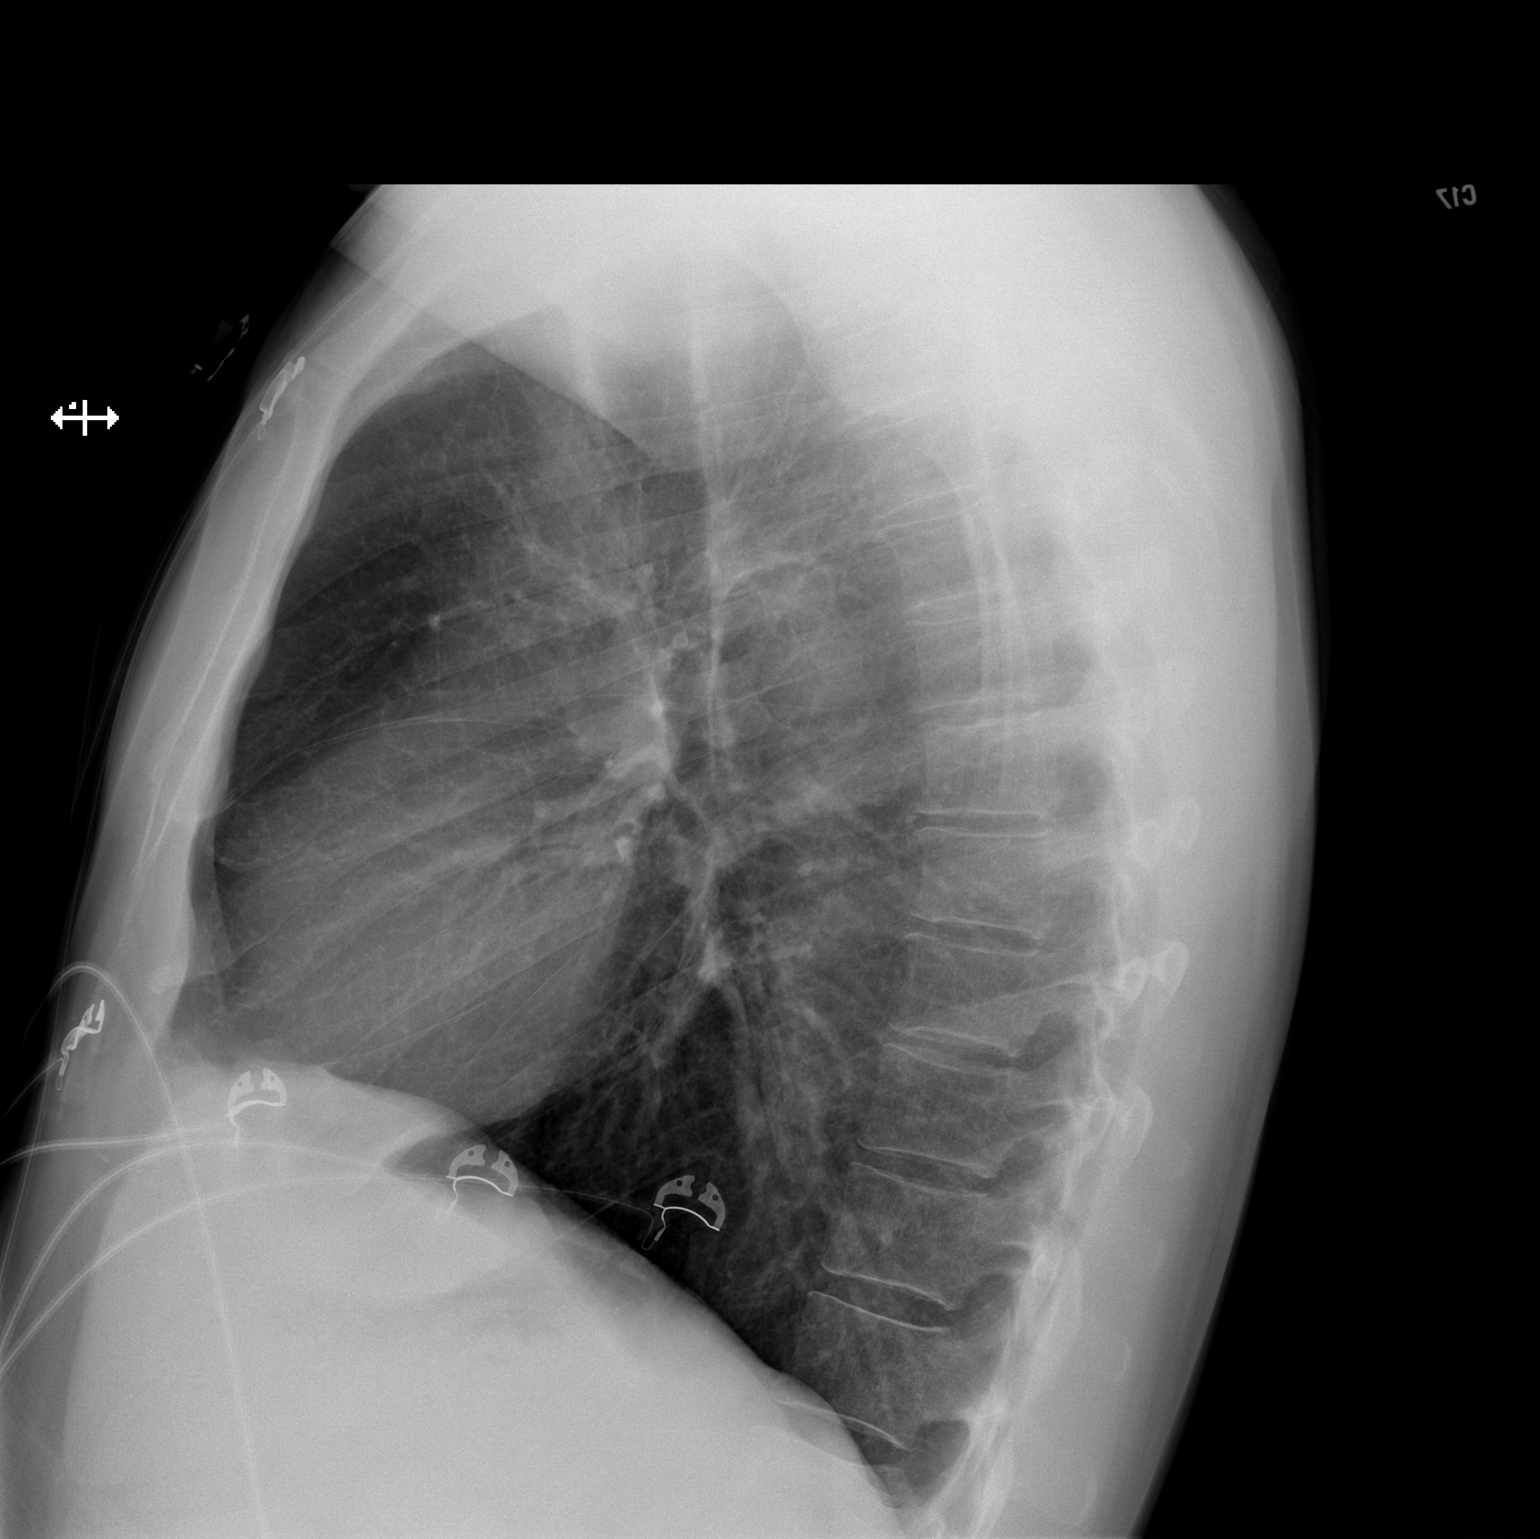

[2 of 2 positions shown; findings below may reference images not displayed]

FINDINGS: The heart size and mediastinal contours are within normal limits.
Both lungs are clear. The visualized skeletal structures are
unremarkable.
IMPRESSION: No active cardiopulmonary disease.

## 2023-03-14 ENCOUNTER — Emergency Department (HOSPITAL_COMMUNITY): Payer: Managed Care, Other (non HMO)

## 2023-03-14 ENCOUNTER — Other Ambulatory Visit: Payer: Self-pay

## 2023-03-14 ENCOUNTER — Emergency Department (HOSPITAL_COMMUNITY)
Admission: EM | Admit: 2023-03-14 | Discharge: 2023-03-14 | Discharge status: Against Medical Advice | Payer: Managed Care, Other (non HMO) | Attending: Emergency Medicine | Admitting: Emergency Medicine

## 2023-03-14 DIAGNOSIS — T40601A Poisoning by unspecified narcotics, accidental (unintentional), initial encounter: Secondary | ICD-10-CM

## 2023-03-14 DIAGNOSIS — R000 Tachycardia, unspecified: Secondary | ICD-10-CM | POA: Insufficient documentation

## 2023-03-14 DIAGNOSIS — T402X1A Poisoning by other opioids, accidental (unintentional), initial encounter: Secondary | ICD-10-CM | POA: Insufficient documentation

## 2023-03-14 DIAGNOSIS — X58XXXA Exposure to other specified factors, initial encounter: Secondary | ICD-10-CM | POA: Insufficient documentation

## 2023-03-14 DIAGNOSIS — J45909 Unspecified asthma, uncomplicated: Secondary | ICD-10-CM | POA: Insufficient documentation

## 2023-03-14 LAB — CBG MONITORING, ED: Glucose-Capillary: 114 mg/dL — ABNORMAL HIGH (ref 70–99)

## 2023-03-14 MED ORDER — NALOXONE HCL 4 MG/0.1ML NA LIQD: NASAL | 0 refills | Status: DC

## 2023-03-14 MED ORDER — LACTATED RINGERS IV BOLUS: 1000.0000 mL | Freq: Once | INTRAVENOUS | Status: DC
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
## 2023-03-14 NOTE — Discharge Instructions (Signed)
Please return to the emergency department if you change your mind about further evaluation.  I am concerned about your rapid heart rate.  A prescription is attached for Narcan.  In the event of an overdose, spray into nostril and call 911.
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
## 2023-10-09 ENCOUNTER — Emergency Department (HOSPITAL_COMMUNITY): Payer: Self-pay

## 2023-10-09 ENCOUNTER — Other Ambulatory Visit: Payer: Self-pay

## 2023-10-09 ENCOUNTER — Encounter (HOSPITAL_COMMUNITY): Payer: Self-pay | Admitting: Emergency Medicine

## 2023-10-09 ENCOUNTER — Emergency Department (HOSPITAL_COMMUNITY)
Admission: EM | Admit: 2023-10-09 | Discharge: 2023-10-10 | Disposition: A | Discharge status: Home / Self Care | Payer: Self-pay | Attending: Emergency Medicine | Admitting: Emergency Medicine

## 2023-10-09 DIAGNOSIS — S60512A Abrasion of left hand, initial encounter: Secondary | ICD-10-CM | POA: Insufficient documentation

## 2023-10-09 DIAGNOSIS — Y92410 Unspecified street and highway as the place of occurrence of the external cause: Secondary | ICD-10-CM | POA: Diagnosis not present

## 2023-10-09 DIAGNOSIS — S060X0A Concussion without loss of consciousness, initial encounter: Secondary | ICD-10-CM | POA: Insufficient documentation

## 2023-10-09 DIAGNOSIS — R1030 Lower abdominal pain, unspecified: Secondary | ICD-10-CM | POA: Diagnosis not present

## 2023-10-09 DIAGNOSIS — R0781 Pleurodynia: Secondary | ICD-10-CM | POA: Diagnosis not present

## 2023-10-09 DIAGNOSIS — M546 Pain in thoracic spine: Secondary | ICD-10-CM | POA: Insufficient documentation

## 2023-10-09 DIAGNOSIS — S0990XA Unspecified injury of head, initial encounter: Secondary | ICD-10-CM | POA: Diagnosis present

## 2023-10-09 DIAGNOSIS — S8001XA Contusion of right knee, initial encounter: Secondary | ICD-10-CM | POA: Insufficient documentation

## 2023-10-09 DIAGNOSIS — J45909 Unspecified asthma, uncomplicated: Secondary | ICD-10-CM | POA: Diagnosis not present

## 2023-10-09 LAB — COMPREHENSIVE METABOLIC PANEL
ALT: 26 U/L (ref 0–44)
AST: 22 U/L (ref 15–41)
Albumin: 3.9 g/dL (ref 3.5–5.0)
Alkaline Phosphatase: 59 U/L (ref 38–126)
Anion gap: 8 (ref 5–15)
BUN: 13 mg/dL (ref 6–20)
CO2: 24 mmol/L (ref 22–32)
Calcium: 8.9 mg/dL (ref 8.9–10.3)
Chloride: 104 mmol/L (ref 98–111)
Creatinine, Ser: 0.93 mg/dL (ref 0.61–1.24)
GFR, Estimated: >60 mL/min (ref >60)
Glucose, Bld: 89 mg/dL (ref 70–99)
Potassium: 3.5 mmol/L (ref 3.5–5.1)
Sodium: 136 mmol/L (ref 135–145)
Total Bilirubin: 1.3 mg/dL — ABNORMAL HIGH (ref 0.0–1.2)
Total Protein: 6.8 g/dL (ref 6.5–8.1)

## 2023-10-09 LAB — I-STAT CHEM 8, ED
BUN: 14 mg/dL (ref 6–20)
Calcium, Ion: 1.16 mmol/L (ref 1.15–1.40)
Chloride: 105 mmol/L (ref 98–111)
Creatinine, Ser: 1 mg/dL (ref 0.61–1.24)
Glucose, Bld: 88 mg/dL (ref 70–99)
HCT: 40 % (ref 39.0–52.0)
Hemoglobin: 13.6 g/dL (ref 13.0–17.0)
Potassium: 4.1 mmol/L (ref 3.5–5.1)
Sodium: 138 mmol/L (ref 135–145)
TCO2: 27 mmol/L (ref 22–32)

## 2023-10-09 LAB — CBC WITH DIFFERENTIAL/PLATELET
Abs Immature Granulocytes: 0.02 10*3/uL (ref 0.00–0.07)
Basophils Absolute: 0.1 10*3/uL (ref 0.0–0.1)
Basophils Relative: 1 %
Eosinophils Absolute: 0.3 10*3/uL (ref 0.0–0.5)
Eosinophils Relative: 3 %
HCT: 41.3 % (ref 39.0–52.0)
Hemoglobin: 14.3 g/dL (ref 13.0–17.0)
Immature Granulocytes: 0 %
Lymphocytes Relative: 21 %
Lymphs Abs: 1.6 10*3/uL (ref 0.7–4.0)
MCH: 31.6 pg (ref 26.0–34.0)
MCHC: 34.6 g/dL (ref 30.0–36.0)
MCV: 91.2 fL (ref 80.0–100.0)
Monocytes Absolute: 0.7 10*3/uL (ref 0.1–1.0)
Monocytes Relative: 9 %
Neutro Abs: 5.1 10*3/uL (ref 1.7–7.7)
Neutrophils Relative %: 66 %
Platelets: 247 10*3/uL (ref 150–400)
RBC: 4.53 MIL/uL (ref 4.22–5.81)
RDW: 13 % (ref 11.5–15.5)
WBC: 7.8 10*3/uL (ref 4.0–10.5)
nRBC: 0 % (ref 0.0–0.2)

## 2023-10-09 LAB — RAPID URINE DRUG SCREEN, HOSP PERFORMED
Amphetamines: NOT DETECTED
Barbiturates: NOT DETECTED
Benzodiazepines: POSITIVE — AB
Cocaine: NOT DETECTED
Opiates: NOT DETECTED
Tetrahydrocannabinol: POSITIVE — AB

## 2023-10-09 LAB — GROUP A STREP BY PCR: Group A Strep by PCR: NOT DETECTED

## 2023-10-09 MED ORDER — ACETAMINOPHEN 500 MG PO TABS
1000.0000 mg | ORAL_TABLET | Freq: Once | ORAL | Status: AC
  Administered 2023-10-09: 1000 mg via ORAL
  Filled 2023-10-09: qty 2

## 2023-10-09 MED ORDER — SODIUM CHLORIDE 0.9 % IV BOLUS
500.0000 mL | Freq: Once | INTRAVENOUS | Status: AC
  Administered 2023-10-09: 500 mL via INTRAVENOUS

## 2023-10-09 MED ORDER — IOHEXOL 350 MG/ML SOLN
100.0000 mL | Freq: Once | INTRAVENOUS | Status: AC | PRN
  Administered 2023-10-09: 100 mL via INTRAVENOUS
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
## 2023-10-09 NOTE — Discharge Instructions (Addendum)
 Make an appointment to follow-up with the orthopedist listed above if your knee pain is not improving.  Your blood pressure was little elevated.  This needs to be followed up by primary care doctor.  Return to the emergency room if you have any worsening symptoms.
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
## 2024-04-19 ENCOUNTER — Other Ambulatory Visit: Payer: Self-pay

## 2024-04-19 ENCOUNTER — Emergency Department (HOSPITAL_COMMUNITY)
Admission: EM | Admit: 2024-04-19 | Discharge: 2024-04-19 | Disposition: A | Discharge status: Home / Self Care | Payer: Self-pay | Attending: Emergency Medicine | Admitting: Emergency Medicine

## 2024-04-19 ENCOUNTER — Emergency Department (HOSPITAL_COMMUNITY): Payer: Self-pay

## 2024-04-19 DIAGNOSIS — J45909 Unspecified asthma, uncomplicated: Secondary | ICD-10-CM | POA: Insufficient documentation

## 2024-04-19 DIAGNOSIS — B349 Viral infection, unspecified: Secondary | ICD-10-CM | POA: Insufficient documentation

## 2024-04-19 DIAGNOSIS — R0602 Shortness of breath: Secondary | ICD-10-CM | POA: Insufficient documentation

## 2024-04-19 LAB — CBC
HCT: 45.9 % (ref 39.0–52.0)
Hemoglobin: 15.3 g/dL (ref 13.0–17.0)
MCH: 29.5 pg (ref 26.0–34.0)
MCHC: 33.3 g/dL (ref 30.0–36.0)
MCV: 88.4 fL (ref 80.0–100.0)
Platelets: 296 K/uL (ref 150–400)
RBC: 5.19 MIL/uL (ref 4.22–5.81)
RDW: 13.1 % (ref 11.5–15.5)
WBC: 6.8 K/uL (ref 4.0–10.5)
nRBC: 0 % (ref 0.0–0.2)

## 2024-04-19 LAB — COMPREHENSIVE METABOLIC PANEL WITH GFR
ALT: 37 U/L (ref 0–44)
AST: 35 U/L (ref 15–41)
Albumin: 5 g/dL (ref 3.5–5.0)
Alkaline Phosphatase: 60 U/L (ref 38–126)
Anion gap: 16 — ABNORMAL HIGH (ref 5–15)
BUN: 11 mg/dL (ref 6–20)
CO2: 21 mmol/L — ABNORMAL LOW (ref 22–32)
Calcium: 9.8 mg/dL (ref 8.9–10.3)
Chloride: 102 mmol/L (ref 98–111)
Creatinine, Ser: 0.99 mg/dL (ref 0.61–1.24)
GFR, Estimated: >60 mL/min (ref >60)
Glucose, Bld: 102 mg/dL — ABNORMAL HIGH (ref 70–99)
Potassium: 3.6 mmol/L (ref 3.5–5.1)
Sodium: 139 mmol/L (ref 135–145)
Total Bilirubin: 0.8 mg/dL (ref 0.0–1.2)
Total Protein: 7.5 g/dL (ref 6.5–8.1)

## 2024-04-19 MED ORDER — PROMETHAZINE HCL 25 MG PO TABS: 25.0000 mg | ORAL_TABLET | Freq: Four times a day (QID) | ORAL | 0 refills | Status: DC | PRN

## 2024-04-19 MED ORDER — PROMETHAZINE HCL 25 MG PO TABS
25.0000 mg | ORAL_TABLET | Freq: Once | ORAL | Status: AC
  Administered 2024-04-19: 25 mg via ORAL
  Filled 2024-04-19: qty 1

## 2024-04-19 MED ORDER — ONDANSETRON HCL 4 MG/2ML IJ SOLN
4.0000 mg | Freq: Once | INTRAMUSCULAR | Status: AC
  Administered 2024-04-19: 4 mg via INTRAVENOUS
  Filled 2024-04-19: qty 2

## 2024-04-19 NOTE — ED Provider Notes (Signed)
 [place] EMERGENCY DEPARTMENT AT [facility] Provider Note   CSN: [id number] Arrival date & time: 04/19/24  1551     Patient presents with: Flu like symptoms   Dakota Gordon is a 43 y.o. male with  past medical history of asthma presenting with an approximate 1 week history of flulike symptoms including generalized myalgias, cough, fatigue along with nausea and vomiting/coughing clear mucus.  His symptoms started with wheezing last week and was seen in urgent care center and was placed on a prednisone taper for 5 days, now completed and wheezing has improved but has had nausea along with subjective fever for approximately 3 days.  No chest pain, no current shortness of breath or wheezing, denies abdominal pain, distention.  He said no other medications prior to arrival other than the prednisone taper which he has completed.   The history is provided by the patient.       Prior to Admission medications   Medication Sig Start Date End Date Taking? Authorizing Provider  promethazine  (PHENERGAN ) 25 MG tablet Take 1 tablet (25 mg total) by mouth every 6 (six) hours as needed for nausea or vomiting. 04/19/24  Yes Kimberly Coye, Mliss, PA-C  albuterol  (PROVENTIL ) (2.5 MG/3ML) 0.083% nebulizer solution Take 2.5 mg by nebulization every 6 (six) hours as needed for wheezing or shortness of breath.  01/08/20   [provider]  albuterol  (VENTOLIN  HFA) 108 (90 Base) MCG/ACT inhaler Inhale 2 puffs into the lungs every 6 (six) hours as needed for wheezing or shortness of breath. Patient not taking: Reported on 01/22/2020 11/19/19   Stallings, Zoe A, MD  albuterol  (VENTOLIN  HFA) 108 (90 Base) MCG/ACT inhaler Inhale 1-2 puffs into the lungs every 6 (six) hours as needed for wheezing or shortness of breath. 11/20/19   Charlyn Sora, MD  cetirizine  (ZYRTEC ) 10 MG tablet Take 10 mg by mouth daily.    [provider]  cetirizine -pseudoephedrine  (ZYRTEC -D) 5-120 MG tablet Take 1 tablet  by mouth daily. Patient not taking: Reported on 01/22/2020 11/20/19   Charlyn Sora, MD  dexamethasone  (DECADRON ) 0.75 MG tablet On day 1 and 2 take 2 tablets BID On day 3 and 4 take 1 tablet BID On day 5 and 6 take 1 tablet QD Patient not taking: Reported on 01/22/2020 11/21/19   Charlyn Sora, MD  naloxone  (NARCAN ) nasal spray 4 mg/0.1 mL In the event of an overdose, spray into nostril and call 911. 03/14/23   Melvenia Motto, MD    Allergies: Prednisone    Review of Systems  Constitutional:  Positive for fever.  HENT:  Negative for congestion and sore throat.   Eyes: Negative.   Respiratory:  Positive for cough. Negative for chest tightness and shortness of breath.   Cardiovascular:  Negative for chest pain.  Gastrointestinal:  Positive for nausea and vomiting. Negative for abdominal pain.  Genitourinary: Negative.   Musculoskeletal:  Negative for arthralgias, joint swelling and neck pain.  Skin: Negative.  Negative for rash and wound.  Neurological:  Negative for dizziness, weakness, light-headedness, numbness and headaches.  Psychiatric/Behavioral: Negative.      Updated Vital Signs BP (!) 162/108   Pulse 83   Temp 98.1 F (36.7 C) (Oral)   Resp (!) 22 Comment: Pt nervous about removal of IV  SpO2 98%   Physical Exam Vitals and nursing note reviewed.  Constitutional:      Appearance: He is well-developed.  HENT:     Head: Normocephalic and atraumatic.  Mouth/Throat:     Mouth: Mucous membranes are moist.     Pharynx: No oropharyngeal exudate or posterior oropharyngeal erythema.  Eyes:     Conjunctiva/sclera: Conjunctivae normal.  Cardiovascular:     Rate and Rhythm: Normal rate and regular rhythm.     Heart sounds: Normal heart sounds.  Pulmonary:     Effort: Pulmonary effort is normal. No respiratory distress.     Breath sounds: Normal breath sounds. No wheezing or rhonchi.  Abdominal:     General: Bowel sounds are normal. There is no distension.      Palpations: Abdomen is soft.     Tenderness: There is no abdominal tenderness. There is no guarding.  Musculoskeletal:        General: Normal range of motion.     Cervical back: Normal range of motion.  Skin:    General: Skin is warm and dry.  Neurological:     General: No focal deficit present.     Mental Status: He is alert.     (all labs ordered are listed, but only abnormal results are displayed) Labs Reviewed  COMPREHENSIVE METABOLIC PANEL WITH GFR - Abnormal; Notable for the following components:      Result Value   CO2 21 (*)    Glucose, Bld 102 (*)    Anion gap 16 (*)    All other components within normal limits  RESP PANEL BY RT-PCR (RSV, FLU A&B, COVID)  [id number]  CBC    EKG: None  Radiology: DG Chest 2 View Result Date: 04/19/2024 CLINICAL DATA:  Cough and fever. Flu-like symptoms for 1 week. Nausea, vomiting, and diarrhea for 3 days. EXAM: CHEST - 2 VIEW COMPARISON:  01/22/2020 FINDINGS: The heart size and mediastinal contours are within normal limits. Both lungs are clear. The visualized skeletal structures are unremarkable. IMPRESSION: No active cardiopulmonary disease. Electronically Signed   By: Elsie Gravely M.D.   On: 04/19/2024 18:12     Procedures   Medications Ordered in the ED  ondansetron  (ZOFRAN ) injection 4 mg (4 mg Intravenous Given 04/19/24 1748)  promethazine  (PHENERGAN ) tablet 25 mg (25 mg Oral Given 04/19/24 2056)                                    Medical Decision Making Patient presented with symptoms suggesting viral syndrome, complaint of cough, initially had wheezing and shortness of breath but now improved after a course of prednisone after seen in urgent care, but now with persistent myalgias, fatigue and nausea with vomiting.  He was given Zofran  here after which he was able to tolerate p.o. intake but he still had complaints of nausea.  Labs and imaging are reassuring today, he was offered Phenergan  in place of Zofran  since it was not  100% effective for him.  He was encouraged to rest, fluids, recheck by primary MD if not improving with this treatment plan.  His exam and labs are reassuring.  Normal  Amount and/or Complexity of Data Reviewed Labs: ordered.    Details: Respiratory panel is negative, his CMET is unremarkable, CBC is normal with a WBC count of 6.8, of note, his LFTs are normal, creatinine normal with no evidence for dehydration. Radiology: ordered and independent interpretation performed.    Details: Chest x-ray reviewed, no pneumonia or other cardiopulmonary process.  Risk Prescription drug management.        Final diagnoses:  Viral syndrome  ED Discharge Orders          Ordered    promethazine  (PHENERGAN ) 25 MG tablet  Every 6 hours PRN        04/19/24 2032               Caroljean Monsivais, PA-C 04/21/24 0019    Patsey Lot, MD 04/27/24 1435

## 2024-04-19 NOTE — Discharge Instructions (Addendum)
 Rest make sure you are drinking plenty of fluids.  Use the Phenergan  if needed for control of your nausea.  Your labs today are reassuring, however as discussed your respiratory panel has not resulted yet.  I will contact you if it is positive for any of these infections.

## 2024-04-19 NOTE — ED Triage Notes (Signed)
 Patient c/o Flu like symptoms x 1 week. Patient report unable to keep anything down.Patient report he took Steroids for 5 days for asthma exacerbation. Patient report N/V/D x 3 days. Patient report taking tylenol  for fever.

## 2024-04-20 LAB — RESP PANEL BY RT-PCR (RSV, FLU A&B, COVID)  RVPGX2
Influenza A by PCR: NEGATIVE
Influenza B by PCR: NEGATIVE
Resp Syncytial Virus by PCR: NEGATIVE
SARS Coronavirus 2 by RT PCR: NEGATIVE

## 2024-07-11 ENCOUNTER — Telehealth: Payer: Self-pay | Admitting: Physician Assistant

## 2024-07-11 DIAGNOSIS — J019 Acute sinusitis, unspecified: Secondary | ICD-10-CM

## 2024-07-11 DIAGNOSIS — B9689 Other specified bacterial agents as the cause of diseases classified elsewhere: Secondary | ICD-10-CM

## 2024-07-11 MED ORDER — PROMETHAZINE-DM 6.25-15 MG/5ML PO SYRP: 5.0000 mL | ORAL_SOLUTION | Freq: Four times a day (QID) | ORAL | 0 refills | Status: AC | PRN

## 2024-07-11 MED ORDER — PREDNISONE 20 MG PO TABS: 40.0000 mg | ORAL_TABLET | Freq: Every day | ORAL | 0 refills | Status: AC

## 2024-07-11 MED ORDER — AMOXICILLIN-POT CLAVULANATE 875-125 MG PO TABS: 1.0000 | ORAL_TABLET | Freq: Two times a day (BID) | ORAL | 0 refills | Status: AC

## 2024-07-11 NOTE — Patient Instructions (Signed)
 Dakota Gordon, thank you for joining Elsie Velma Lunger, PA-C for today's virtual visit.  While this provider is not your primary care provider (PCP), if your PCP is located in our provider database this encounter information will be shared with them immediately following your visit.   A [facility] MyChart account gives you access to today's visit and all your visits, tests, and labs performed at [facility]  click here if you don't have a [facility] MyChart account or go to [email or web address]  Consent: (Patient) Dakota Gordon provided verbal consent for this virtual visit at the beginning of the encounter.  Current Medications:  Current Outpatient Medications:    albuterol  (PROVENTIL ) (2.5 MG/3ML) 0.083% nebulizer solution, Take 2.5 mg by nebulization every 6 (six) hours as needed for wheezing or shortness of breath. , Disp: , Rfl:    albuterol  (VENTOLIN  HFA) 108 (90 Base) MCG/ACT inhaler, Inhale 2 puffs into the lungs every 6 (six) hours as needed for wheezing or shortness of breath. (Patient not taking: Reported on 01/22/2020), Disp: 18 g, Rfl: 0   albuterol  (VENTOLIN  HFA) 108 (90 Base) MCG/ACT inhaler, Inhale 1-2 puffs into the lungs every 6 (six) hours as needed for wheezing or shortness of breath., Disp: 18 g, Rfl: 0   cetirizine  (ZYRTEC ) 10 MG tablet, Take 10 mg by mouth daily., Disp: , Rfl:    cetirizine -pseudoephedrine  (ZYRTEC -D) 5-120 MG tablet, Take 1 tablet by mouth daily. (Patient not taking: Reported on 01/22/2020), Disp: 7 tablet, Rfl: 0   dexamethasone  (DECADRON ) 0.75 MG tablet, On day 1 and 2 take 2 tablets BID On day 3 and 4 take 1 tablet BID On day 5 and 6 take 1 tablet QD (Patient not taking: Reported on 01/22/2020), Disp: 14 tablet, Rfl: 0   naloxone  (NARCAN ) nasal spray 4 mg/0.1 mL, In the event of an overdose, spray into nostril and call 911., Disp: 1 each, Rfl: 0   promethazine  (PHENERGAN ) 25 MG tablet, Take 1 tablet (25 mg total) by mouth every  6 (six) hours as needed for nausea or vomiting., Disp: 30 tablet, Rfl: 0   Medications ordered in this encounter:  No orders of the defined types were placed in this encounter.    *If you need refills on other medications prior to your next appointment, please contact your pharmacy*  Follow-Up: Call back or seek an in-person evaluation if the symptoms worsen or if the condition fails to improve as anticipated.  [facility] Virtual Care [phone]  Other Instructions Please take antibiotic as directed.  Increase fluid intake.  Use Saline nasal spray.  Take a daily multivitamin. Take all prescribed medications as directed.  Place a humidifier in the bedroom.  Please call or return clinic if symptoms are not improving.  Sinusitis Sinusitis is redness, soreness, and swelling (inflammation) of the paranasal sinuses. Paranasal sinuses are air pockets within the bones of your face (beneath the eyes, the middle of the forehead, or above the eyes). In healthy paranasal sinuses, mucus is able to drain out, and air is able to circulate through them by way of your nose. However, when your paranasal sinuses are inflamed, mucus and air can become trapped. This can allow bacteria and other germs to grow and cause infection. Sinusitis can develop quickly and last only a short time (acute) or continue over a long period (chronic). Sinusitis that lasts for more than 12 weeks is considered chronic.  CAUSES  Causes of sinusitis include: Allergies. Structural abnormalities, such as displacement of  the cartilage that separates your nostrils (deviated septum), which can decrease the air flow through your nose and sinuses and affect sinus drainage. Functional abnormalities, such as when the small hairs (cilia) that line your sinuses and help remove mucus do not work properly or are not present. SYMPTOMS  Symptoms of acute and chronic sinusitis are the same. The primary symptoms are pain and pressure around the  affected sinuses. Other symptoms include: Upper toothache. Earache. Headache. Bad breath. Decreased sense of smell and taste. A cough, which worsens when you are lying flat. Fatigue. Fever. Thick drainage from your nose, which often is green and may contain pus (purulent). Swelling and warmth over the affected sinuses. DIAGNOSIS  Your caregiver will perform a physical exam. During the exam, your caregiver may: Look in your nose for signs of abnormal growths in your nostrils (nasal polyps). Tap over the affected sinus to check for signs of infection. View the inside of your sinuses (endoscopy) with a special imaging device with a light attached (endoscope), which is inserted into your sinuses. If your caregiver suspects that you have chronic sinusitis, one or more of the following tests may be recommended: Allergy tests. Nasal culture A sample of mucus is taken from your nose and sent to a lab and screened for bacteria. Nasal cytology A sample of mucus is taken from your nose and examined by your caregiver to determine if your sinusitis is related to an allergy. TREATMENT  Most cases of acute sinusitis are related to a viral infection and will resolve on their own within 10 days. Sometimes medicines are prescribed to help relieve symptoms (pain medicine, decongestants, nasal steroid sprays, or saline sprays).  However, for sinusitis related to a bacterial infection, your caregiver will prescribe antibiotic medicines. These are medicines that will help kill the bacteria causing the infection.  Rarely, sinusitis is caused by a fungal infection. In theses cases, your caregiver will prescribe antifungal medicine. For some cases of chronic sinusitis, surgery is needed. Generally, these are cases in which sinusitis recurs more than 3 times per year, despite other treatments. HOME CARE INSTRUCTIONS  Drink plenty of water. Water helps thin the mucus so your sinuses can drain more easily. Use a  humidifier. Inhale steam 3 to 4 times a day (for example, sit in the bathroom with the shower running). Apply a warm, moist washcloth to your face 3 to 4 times a day, or as directed by your caregiver. Use saline nasal sprays to help moisten and clean your sinuses. Take over-the-counter or prescription medicines for pain, discomfort, or fever only as directed by your caregiver. SEEK IMMEDIATE MEDICAL CARE IF: You have increasing pain or severe headaches. You have nausea, vomiting, or drowsiness. You have swelling around your face. You have vision problems. You have a stiff neck. You have difficulty breathing. MAKE SURE YOU:  Understand these instructions. Will watch your condition. Will get help right away if you are not doing well or get worse. Document Released: 07/11/2005 Document Revised: 10/03/2011 Document Reviewed: 07/26/2011 [facility] Patient Information 2014 [place], [place].    If you have been instructed to have an in-person evaluation today at a local Urgent Care facility, please use the link below. It will take you to a list of all of our available [facility], including address, phone number and hours of operation. Please do not delay care.  [facility]  If you or a family member do not have a primary care provider, use the link  below to schedule a visit and establish care. When you choose a [facility] primary care physician or advanced practice provider, you gain a long-term partner in health. Find a Primary Care Provider  Learn more about [facility]'s in-office and virtual care options: [facility] - Get Care Now

## 2024-07-11 NOTE — Progress Notes (Signed)
 Virtual Visit Consent   Dakota Gordon, you are scheduled for a virtual visit with a [facility] provider today. Just as with appointments in the office, your consent must be obtained to participate. Your consent will be active for this visit and any virtual visit you may have with one of our providers in the next 365 days. If you have a MyChart account, a copy of this consent can be sent to you electronically.  As this is a virtual visit, video technology does not allow for your provider to perform a traditional examination. This may limit your provider's ability to fully assess your condition. If your provider identifies any concerns that need to be evaluated in person or the need to arrange testing (such as labs, EKG, etc.), we will make arrangements to do so. Although advances in technology are sophisticated, we cannot ensure that it will always work on either your end or our end. If the connection with a video visit is poor, the visit may have to be switched to a telephone visit. With either a video or telephone visit, we are not always able to ensure that we have a secure connection.  By engaging in this virtual visit, you consent to the provision of healthcare and authorize for your insurance to be billed (if applicable) for the services provided during this visit. Depending on your insurance coverage, you may receive a charge related to this service.  I need to obtain your verbal consent now. Are you willing to proceed with your visit today? Dakota Gordon has provided verbal consent on 07/11/2024 for a virtual visit (video or telephone). Elsie Velma Lunger, [place]  Date: 07/11/2024 2:32 PM   Virtual Visit via Video Note   I, Elsie Velma Lunger, connected with  Dakota Gordon  ([id number], [birth year]) on 07/11/2024 at  2:30 PM EST by a video-enabled telemedicine application and verified that I am speaking with the correct person using two identifiers.  Location: Patient: Virtual  Visit Location Patient: Home Provider: Virtual Visit Location Provider: Home Office   I discussed the limitations of evaluation and management by telemedicine and the availability of in person appointments. The patient expressed understanding and agreed to proceed.    History of Present Illness: Dakota Gordon is a 43 y.o. who identifies as a male who was assigned male at birth, and is being seen today for sinus congestion, chest congestion over the past 2 weeks or so. Symptoms initially waxing and waning but now more consistently worsening. Denies fever, chills. Chest tightness with wheezing at night time.   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  Resting comfortably at home.  Head is normocephalic, atraumatic.  No labored breathing.  Speech is clear and coherent with logical content.  Patient is alert and oriented at baseline.   Assessment and Plan: 1. Acute bacterial sinusitis (Primary) - amoxicillin -clavulanate (AUGMENTIN ) 875-125 MG tablet; Take 1 tablet by mouth 2 (two) times daily.  Dispense: 14 tablet; Refill: 0 - promethazine -dextromethorphan (PROMETHAZINE -DM) 6.25-15 MG/5ML syrup; Take 5 mLs by mouth 4 (four) times daily as needed for cough.  Dispense: 118 mL; Refill: 0 - predniSONE  (DELTASONE ) 20 MG tablet; Take 2 tablets (40 mg total) by mouth daily with breakfast.  Dispense: 10 tablet; Refill:  0  Rx Augmentin .  Increase fluids.  Rest.  Saline nasal spray.  Probiotic.  Mucinex as directed.  Humidifier in bedroom. Promethazine -DM.  Call or return to clinic if symptoms are not improving.   Follow Up Instructions: I discussed the assessment and treatment plan with the  patient. The patient was provided an opportunity to ask questions and all were answered. The patient agreed with the plan and demonstrated an understanding of the instructions.  A copy of instructions were sent to the patient via MyChart unless otherwise noted below.   The patient was advised to call back or seek an in-person evaluation if the symptoms worsen or if the condition fails to improve as anticipated.    Elsie Velma Lunger, PA-C    [1] No Active Allergies [2]  Current Outpatient Medications:    amoxicillin -clavulanate (AUGMENTIN ) 875-125 MG tablet, Take 1 tablet by mouth 2 (two) times daily., Disp: 14 tablet, Rfl: 0   predniSONE  (DELTASONE ) 20 MG tablet, Take 2 tablets (40 mg total) by mouth daily with breakfast., Disp: 10 tablet, Rfl: 0   promethazine -dextromethorphan (PROMETHAZINE -DM) 6.25-15 MG/5ML syrup, Take 5 mLs by mouth 4 (four) times daily as needed for cough., Disp: 118 mL, Rfl: 0   albuterol  (PROVENTIL ) (2.5 MG/3ML) 0.083% nebulizer solution, Take 2.5 mg by nebulization every 6 (six) hours as needed for wheezing or shortness of breath. , Disp: , Rfl:    albuterol  (VENTOLIN  HFA) 108 (90 Base) MCG/ACT inhaler, Inhale 1-2 puffs into the lungs every 6 (six) hours as needed for wheezing or shortness of breath., Disp: 18 g, Rfl: 0   cetirizine  (ZYRTEC ) 10 MG tablet, Take 10 mg by mouth daily., Disp: , Rfl:    cetirizine -pseudoephedrine  (ZYRTEC -D) 5-120 MG tablet, Take 1 tablet by mouth daily. (Patient not taking: Reported on 01/22/2020), Disp: 7 tablet, Rfl: 0

## 2024-07-14 ENCOUNTER — Telehealth: Payer: Self-pay | Admitting: Family Medicine

## 2024-07-14 DIAGNOSIS — J019 Acute sinusitis, unspecified: Secondary | ICD-10-CM

## 2024-07-14 DIAGNOSIS — B9689 Other specified bacterial agents as the cause of diseases classified elsewhere: Secondary | ICD-10-CM

## 2024-07-14 DIAGNOSIS — R5383 Other fatigue: Secondary | ICD-10-CM

## 2024-07-14 NOTE — Patient Instructions (Signed)
 Fatigue If you have fatigue, you feel tired all the time and have a lack of energy or a lack of motivation. Fatigue may make it difficult to start or complete tasks because of exhaustion. Occasional or mild fatigue is often a normal response to activity or life. However, long-term (chronic) or extreme fatigue may be a symptom of a medical condition such as: Depression. Not having enough red blood cells or hemoglobin in the blood (anemia). A problem with a small gland located in the lower front part of the neck (thyroid disorder). Rheumatologic conditions. These are problems related to the body's defense system (immune system). Infections, especially certain viral infections. Fatigue can also lead to negative health outcomes over time. Follow these instructions at home: Medicines Take over-the-counter and prescription medicines only as told by your health care provider. Take a multivitamin if told by your health care provider. Do not use herbal or dietary supplements unless they are approved by your health care provider. Eating and drinking  Avoid heavy meals in the evening. Eat a well-balanced diet, which includes lean proteins, whole grains, plenty of fruits and vegetables, and low-fat dairy products. Avoid eating or drinking too many products with caffeine in them. Avoid alcohol. Drink enough fluid to keep your urine pale yellow. Activity  Exercise regularly, as told by your health care provider. Use or practice techniques to help you relax, such as yoga, tai chi, meditation, or massage therapy. Lifestyle Change situations that cause you stress. Try to keep your work and personal schedules in balance. Do not use recreational or illegal drugs. General instructions Monitor your fatigue for any changes. Go to bed and get up at the same time every day. Avoid fatigue by pacing yourself during the day and getting enough sleep at night. Maintain a healthy weight. Contact a health care  provider if: Your fatigue does not get better. You have a fever. You suddenly lose or gain weight. You have headaches. You have trouble falling asleep or sleeping through the night. You feel angry, guilty, anxious, or sad. You have swelling in your legs or another part of your body. Get help right away if: You feel confused, feel like you might faint, or faint. Your vision is blurry or you have a severe headache. You have severe pain in your abdomen, your back, or the area between your waist and hips (pelvis). You have chest pain, shortness of breath, or an irregular or fast heartbeat. You are unable to urinate, or you urinate less than normal. You have abnormal bleeding from the rectum, nose, lungs, nipples, or, if you are male, the vagina. You vomit blood. You have thoughts about hurting yourself or others. These symptoms may be an emergency. Get help right away. Call 911. Do not wait to see if the symptoms will go away. Do not drive yourself to the hospital. Get help right away if you feel like you may hurt yourself or others, or have thoughts about taking your own life. Go to your nearest emergency room or: Call 911. Call the National Suicide Prevention Lifeline at (262)721-8699 or 988. This is open 24 hours a day. Text the Crisis Text Line at 8450584327. Summary If you have fatigue, you feel tired all the time and have a lack of energy or a lack of motivation. Fatigue may make it difficult to start or complete tasks because of exhaustion. Long-term (chronic) or extreme fatigue may be a symptom of a medical condition. Exercise regularly, as told by your health care provider.  Change situations that cause you stress. Try to keep your work and personal schedules in balance. This information is not intended to replace advice given to you by your health care provider. Make sure you discuss any questions you have with your health care provider. Document Revised: 05/03/2021 Document  Reviewed: 05/03/2021 Elsevier Patient Education  2024 [employer].

## 2024-07-14 NOTE — Progress Notes (Signed)
 " Virtual Visit Consent   Dakota Gordon, you are scheduled for a virtual visit with a [facility] provider today. Just as with appointments in the office, your consent must be obtained to participate. Your consent will be active for this visit and any virtual visit you may have with one of our providers in the next 365 days. If you have a MyChart account, a copy of this consent can be sent to you electronically.  As this is a virtual visit, video technology does not allow for your provider to perform a traditional examination. This may limit your provider's ability to fully assess your condition. If your provider identifies any concerns that need to be evaluated in person or the need to arrange testing (such as labs, EKG, etc.), we will make arrangements to do so. Although advances in technology are sophisticated, we cannot ensure that it will always work on either your end or our end. If the connection with a video visit is poor, the visit may have to be switched to a telephone visit. With either a video or telephone visit, we are not always able to ensure that we have a secure connection.  By engaging in this virtual visit, you consent to the provision of healthcare and authorize for your insurance to be billed (if applicable) for the services provided during this visit. Depending on your insurance coverage, you may receive a charge related to this service.  I need to obtain your verbal consent now. Are you willing to proceed with your visit today? Dakota Gordon has provided verbal consent on 07/14/2024 for a virtual visit (video or telephone). Dakota Lamp, Dakota Gordon  Date: 07/14/2024 6:31 PM   Virtual Visit via Video Note   I, Dakota Gordon, connected with  Dakota Gordon  ([id number], 1981-05-01) on 07/14/2024 at  6:30 PM EST by a video-enabled telemedicine application and verified that I am speaking with the correct person using two identifiers.  Location: Patient: Virtual Visit Location  Patient: Home Provider: Virtual Visit Location Provider: Home Office   I discussed the limitations of evaluation and management by telemedicine and the availability of in person appointments. The patient expressed understanding and agreed to proceed.    History of Present Illness: Dakota Gordon is a 43 y.o. who identifies as a male who was assigned male at birth, and is being seen today for cough, sinus, prednisone  and amoxicillin  taken. He says he just still feels wiped out and weak. His covid test at home early on was neg. 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  Resting comfortably  at home.  Head is normocephalic, atraumatic.  No labored breathing.  Speech is clear and coherent with logical content.  Patient is alert and oriented at baseline.    Assessment and Plan: There are no diagnoses linked to this encounter. Increase fluids, rest ,MVI, urgent care for labs is sx persist or worsen. Recheck in home covid and flu testing.   Follow Up Instructions: I discussed the assessment and treatment plan with the patient. The patient was provided an opportunity to ask questions and all were answered. The patient agreed with the plan and demonstrated an understanding of the  instructions.  A copy of instructions were sent to the patient via MyChart unless otherwise noted below.     The patient was advised to call back or seek an in-person evaluation if the symptoms worsen or if the condition fails to improve as anticipated.    Yonathan Perrow, Dakota Gordon     [1] No Active Allergies [2]  Current Outpatient Medications:    albuterol   (PROVENTIL ) (2.5 MG/3ML) 0.083% nebulizer solution, Take 2.5 mg by nebulization every 6 (six) hours as needed for wheezing or shortness of breath. , Disp: , Rfl:    albuterol  (VENTOLIN  HFA) 108 (90 Base) MCG/ACT inhaler, Inhale 1-2 puffs into the lungs every 6 (six) hours as needed for wheezing or shortness of breath., Disp: 18 g, Rfl: 0   amoxicillin -clavulanate (AUGMENTIN ) 875-125 MG tablet, Take 1 tablet by mouth 2 (two) times daily., Disp: 14 tablet, Rfl: 0   cetirizine  (ZYRTEC ) 10 MG tablet, Take 10 mg by mouth daily., Disp: , Rfl:    cetirizine -pseudoephedrine  (ZYRTEC -D) 5-120 MG tablet, Take 1 tablet by mouth daily. (Patient not taking: Reported on 01/22/2020), Disp: 7 tablet, Rfl: 0   predniSONE  (DELTASONE ) 20 MG tablet, Take 2 tablets (40 mg total) by mouth daily with breakfast., Disp: 10 tablet, Rfl: 0   promethazine -dextromethorphan (PROMETHAZINE -DM) 6.25-15 MG/5ML syrup, Take 5 mLs by mouth 4 (four) times daily as needed for cough., Disp: 118 mL, Rfl: 0  "
